# Patient Record
Sex: Female | Born: 1965 | Race: White | Hispanic: No | Marital: Married | Smoking: Never smoker
Health system: Southern US, Community
[De-identification: ages and names within clinical notes are randomized; demographics above are authoritative.]

## PROBLEM LIST (undated history)

## (undated) DIAGNOSIS — Z8619 Personal history of other infectious and parasitic diseases: Secondary | ICD-10-CM

## (undated) DIAGNOSIS — Z87442 Personal history of urinary calculi: Secondary | ICD-10-CM

## (undated) DIAGNOSIS — N6009 Solitary cyst of unspecified breast: Secondary | ICD-10-CM

## (undated) DIAGNOSIS — I10 Essential (primary) hypertension: Secondary | ICD-10-CM

## (undated) HISTORY — DX: Personal history of other infectious and parasitic diseases: Z86.19

## (undated) HISTORY — DX: Personal history of urinary calculi: Z87.442

## (undated) HISTORY — DX: Essential (primary) hypertension: I10

## (undated) HISTORY — DX: Solitary cyst of unspecified breast: N60.09

---

## 1995-05-09 HISTORY — PX: CRYOTHERAPY: SHX1416

## 1995-05-09 HISTORY — PX: COLPOSCOPY: SHX161

## 2005-02-05 DIAGNOSIS — N6009 Solitary cyst of unspecified breast: Secondary | ICD-10-CM

## 2005-02-05 HISTORY — DX: Solitary cyst of unspecified breast: N60.09

## 2005-02-07 ENCOUNTER — Ambulatory Visit: Payer: Self-pay | Admitting: Unknown Physician Specialty

## 2007-01-01 ENCOUNTER — Ambulatory Visit: Payer: Self-pay | Admitting: Unknown Physician Specialty

## 2008-04-01 ENCOUNTER — Ambulatory Visit: Payer: Self-pay | Admitting: Unknown Physician Specialty

## 2008-05-07 ENCOUNTER — Ambulatory Visit: Payer: Self-pay | Admitting: Urology

## 2008-06-10 ENCOUNTER — Ambulatory Visit: Payer: Self-pay | Admitting: Unknown Physician Specialty

## 2008-11-03 ENCOUNTER — Ambulatory Visit: Payer: Self-pay | Admitting: Urology

## 2008-11-10 ENCOUNTER — Ambulatory Visit: Payer: Self-pay | Admitting: Urology

## 2008-11-12 ENCOUNTER — Ambulatory Visit: Payer: Self-pay | Admitting: Urology

## 2008-12-03 ENCOUNTER — Ambulatory Visit: Payer: Self-pay | Admitting: Urology

## 2009-06-01 ENCOUNTER — Ambulatory Visit: Payer: Self-pay | Admitting: Urology

## 2009-11-15 ENCOUNTER — Ambulatory Visit: Payer: Self-pay | Admitting: Unknown Physician Specialty

## 2010-11-24 ENCOUNTER — Ambulatory Visit: Payer: Self-pay | Admitting: Unknown Physician Specialty

## 2010-12-05 ENCOUNTER — Ambulatory Visit: Payer: Self-pay | Admitting: Unknown Physician Specialty

## 2011-11-28 ENCOUNTER — Ambulatory Visit: Payer: Self-pay

## 2011-11-30 ENCOUNTER — Ambulatory Visit: Payer: Self-pay

## 2016-02-28 ENCOUNTER — Other Ambulatory Visit: Payer: Self-pay | Admitting: Certified Nurse Midwife

## 2016-02-28 DIAGNOSIS — Z1231 Encounter for screening mammogram for malignant neoplasm of breast: Secondary | ICD-10-CM

## 2016-03-01 ENCOUNTER — Ambulatory Visit: Admission: RE | Admit: 2016-03-01 | Payer: Self-pay | Source: Ambulatory Visit

## 2016-04-14 ENCOUNTER — Ambulatory Visit: Payer: Self-pay

## 2016-05-02 ENCOUNTER — Ambulatory Visit
Admission: RE | Admit: 2016-05-02 | Discharge: 2016-05-02 | Disposition: A | Discharge status: Home / Self Care | Payer: BC Managed Care – PPO | Source: Ambulatory Visit | Attending: Certified Nurse Midwife | Admitting: Certified Nurse Midwife

## 2016-05-02 DIAGNOSIS — Z1231 Encounter for screening mammogram for malignant neoplasm of breast: Secondary | ICD-10-CM | POA: Diagnosis present

## 2016-05-05 ENCOUNTER — Inpatient Hospital Stay
Admission: RE | Admit: 2016-05-05 | Discharge: 2016-05-05 | Disposition: A | Discharge status: Home / Self Care | Payer: Self-pay | Source: Ambulatory Visit | Attending: *Deleted | Admitting: *Deleted

## 2016-05-05 ENCOUNTER — Other Ambulatory Visit: Payer: Self-pay | Admitting: *Deleted

## 2016-05-05 DIAGNOSIS — Z9289 Personal history of other medical treatment: Secondary | ICD-10-CM

## 2016-05-09 ENCOUNTER — Ambulatory Visit: Payer: Self-pay

## 2016-05-10 ENCOUNTER — Other Ambulatory Visit: Payer: Self-pay | Admitting: Certified Nurse Midwife

## 2016-05-10 DIAGNOSIS — N6320 Unspecified lump in the left breast, unspecified quadrant: Secondary | ICD-10-CM

## 2016-05-10 DIAGNOSIS — R928 Other abnormal and inconclusive findings on diagnostic imaging of breast: Secondary | ICD-10-CM

## 2016-05-18 ENCOUNTER — Ambulatory Visit
Admission: RE | Admit: 2016-05-18 | Discharge: 2016-05-18 | Disposition: A | Discharge status: Home / Self Care | Payer: BC Managed Care – PPO | Source: Ambulatory Visit | Attending: Certified Nurse Midwife | Admitting: Certified Nurse Midwife

## 2016-05-18 DIAGNOSIS — N6320 Unspecified lump in the left breast, unspecified quadrant: Secondary | ICD-10-CM

## 2016-05-18 DIAGNOSIS — R928 Other abnormal and inconclusive findings on diagnostic imaging of breast: Secondary | ICD-10-CM

## 2016-05-18 DIAGNOSIS — N6002 Solitary cyst of left breast: Secondary | ICD-10-CM | POA: Insufficient documentation

## 2016-06-16 HISTORY — PX: COLONOSCOPY: SHX174

## 2017-02-27 ENCOUNTER — Ambulatory Visit: Payer: Self-pay | Admitting: Certified Nurse Midwife

## 2017-03-14 ENCOUNTER — Ambulatory Visit: Payer: Self-pay | Admitting: Certified Nurse Midwife

## 2017-03-27 ENCOUNTER — Ambulatory Visit (INDEPENDENT_AMBULATORY_CARE_PROVIDER_SITE_OTHER): Payer: BC Managed Care – PPO | Admitting: Certified Nurse Midwife

## 2017-03-27 ENCOUNTER — Encounter: Payer: Self-pay | Admitting: Certified Nurse Midwife

## 2017-03-27 ENCOUNTER — Ambulatory Visit: Payer: Self-pay | Admitting: Certified Nurse Midwife

## 2017-03-27 VITALS — BP 110/64 | HR 95 | Ht 65.0 in | Wt 160.0 lb

## 2017-03-27 DIAGNOSIS — Z124 Encounter for screening for malignant neoplasm of cervix: Secondary | ICD-10-CM | POA: Diagnosis not present

## 2017-03-27 DIAGNOSIS — Z01419 Encounter for gynecological examination (general) (routine) without abnormal findings: Secondary | ICD-10-CM | POA: Diagnosis not present

## 2017-03-27 DIAGNOSIS — Z1231 Encounter for screening mammogram for malignant neoplasm of breast: Secondary | ICD-10-CM

## 2017-03-27 DIAGNOSIS — N6019 Diffuse cystic mastopathy of unspecified breast: Secondary | ICD-10-CM

## 2017-03-27 DIAGNOSIS — Z1239 Encounter for other screening for malignant neoplasm of breast: Secondary | ICD-10-CM

## 2017-03-27 DIAGNOSIS — N644 Mastodynia: Secondary | ICD-10-CM | POA: Diagnosis not present

## 2017-03-27 NOTE — Progress Notes (Signed)
Gynecology Annual Exam  [facility] Chief Complaint:  Chief Complaint  Patient presents with  . Gynecologic Exam    History of Present Illness:Rachel Colon presents today for her annual exam. She is a 51 year old Caucasian/White female, G3 P2012, whose LMP was 03/16/2017. She is having no significant problems. Has been having cyclical  breast tenderness especially in the left breast. She has a history of FC changes.  Last Pap: 10/29/2013 Result: NORMAL , HPV Negative Ever had a abnormal pap yes in her 520s, requiring a colposcopy with a biopsy that showed normal results. She had a cryosurgery of her cervix.   Last Mammogram: 05/02/2017 Result: benign cyst in left breast-Birads 2  Osteoporosis Prevention: Exercise (walks every day), gets adequate calcium in her diet  Contraception Method: condoms.  Her menses are regular and come monthly , lasting 5-6 days with 1 heavier days requiring pad change q2hours , and are not painful  She has had no spotting.  The patient's past medical history is remarkable for breast cysts and kidney stones.. Since her last annual GYN exam 1 year ago, she has had a colonoscopy 06/14/2016 which showed hemorrhoids. She has also fractured a bone in her left foot. Wore a boot x 2 weeks. Still is painful.   She is sexually active and reports no problems with intercourse..  There is no family history of breast cancer The patient does do monthly self breast exams. There is no history of ovarian cancer   The patient is a minimal drinker and does not smoke.    Review of Systems: Review of Systems  Constitutional: Positive for weight loss (intentional 12 pound weight loss). Negative for chills and fever.  HENT: Negative for congestion, sinus pain and sore throat.   Eyes: Negative for blurred vision and pain.  Respiratory: Negative for hemoptysis, shortness of breath and wheezing.   Cardiovascular: Negative for chest pain, palpitations and leg  swelling.  Gastrointestinal: Negative for abdominal pain, blood in stool, diarrhea, heartburn, nausea and vomiting.  Genitourinary: Negative for dysuria, frequency, hematuria and urgency.  Musculoskeletal: Negative for back pain, joint pain and myalgias.       Positive for Left foot pain  Skin: Negative for itching and rash.  Neurological: Negative for dizziness, tingling and headaches.  Endo/Heme/Allergies: Negative for environmental allergies and polydipsia. Does not bruise/bleed easily.       Negative for hirsutism Positive for cyclical breast tenderness  Psychiatric/Behavioral: Negative for depression. The patient is not nervous/anxious and does not have insomnia.     Past Medical History:  Past Medical History:  Diagnosis Date  . Breast cyst 02/2005   bilateral  . H/O cold sores   . History of kidney stones     Past Surgical History:  Past Surgical History:  Procedure Laterality Date  . COLONOSCOPY  06/16/2016  . COLPOSCOPY  1997  . CRYOTHERAPY  1997    Family History:  Family History  Problem Relation Age of Onset  . Hypertension Father   . Heart attack Maternal Grandmother 50  . Breast cancer Neg Hx     Social History:  Social History   Socioeconomic History  . Marital status: Married    Spouse name: Not on file  . Number of children: 2  . Years of education: Not on file  . Highest education level: Not on file  Social Needs  . Financial resource strain: Not on file  . Food insecurity - worry:  Not on file  . Food insecurity - inability: Not on file  . Transportation needs - medical: Not on file  . Transportation needs - non-medical: Not on file  Occupational History  . Occupation: Geophysicist/field seismologistTeaching Assistant  Tobacco Use  . Smoking status: Never Smoker  . Smokeless tobacco: Never Used  Substance and Sexual Activity  . Alcohol use: Yes    Frequency: Never    Comment: rare  . Drug use: No  . Sexual activity: Yes    Partners: Male    Birth  control/protection: Condom  Other Topics Concern  . Not on file  Social History Narrative  . Not on file    Allergies:  Allergies  Allergen Reactions  . Penicillins Other (See Comments)    infancy     Medications: Current Outpatient Medications on File Prior to Visit  Medication Sig Dispense Refill  . acyclovir (ZOVIRAX) 400 MG tablet     . esomeprazole (NEXIUM) 40 MG capsule Take by mouth.    . ranitidine (ZANTAC) 150 MG capsule ranitidine 150 mg capsule     No current facility-administered medications on file prior to visit.   Physical Exam Vitals: BP 110/64   Pulse 95   Ht 5\' 5"  (1.651 m)   Wt 160 lb (72.6 kg)   LMP 03/16/2017 (Exact Date)   BMI 26.63 kg/m   General: WF in NAD HEENT: normocephalic, anicteric Neck: no thyroid enlargement, no palpable nodules, no cervical lymphadenopathy  Pulmonary: No increased work of breathing, CTAB Cardiovascular: RRR, without murmur  Breast: Breast symmetrical, no tenderness, no palpable nodules or masses, no skin or nipple retraction present, no nipple discharge.  No axillary, infraclavicular or supraclavicular lymphadenopathy. Abdomen: Soft, non-tender, non-distended.  Umbilicus without lesions.  No hepatomegaly or masses palpable. No evidence of hernia. Genitourinary:  External: Normal external female genitalia.  Normal urethral meatus, normal  Bartholin's and Skene's glands.    Vagina: Normal vaginal mucosa, no evidence of prolapse.    Cervix: Grossly normal in appearance, no bleeding, non-tender  Uterus: Anteverted, normal size, shape, and consistency, mobile, and non-tender  Adnexa: No adnexal masses, non-tender  Rectal: deferred  Lymphatic: no evidence of inguinal lymphadenopathy Extremities: no edema, erythema, or tenderness Neurologic: Grossly intact Psychiatric: mood appropriate, affect full     Assessment: 51 y.o. annual gyn exam Mastalgia, cyclical  Plan:    1) Breast cancer screening - recommend monthly  self breast exam and annual screening mammograms. Mammogram was ordered today.  2) DIscussed decreasing methylxanthines in diet .  Recommended vitamin E 400 IU BID.for mastalgia  3) Cervical cancer screening - Pap was done. ASCCP guidelines and rational discussed.  Patient opts for every 3 years screening interval  4) Contraception - Explained that contraception is needed until she has not had a menses x 1 year.  5) Routine healthcare maintenance including cholesterol and diabetes screening managed by PCP   6) RTO 1 year and prn  Farrel Connersolleen Dahmir Epperly, CNM

## 2017-03-30 ENCOUNTER — Encounter: Payer: Self-pay | Admitting: Certified Nurse Midwife

## 2017-03-30 DIAGNOSIS — N6019 Diffuse cystic mastopathy of unspecified breast: Secondary | ICD-10-CM | POA: Insufficient documentation

## 2017-03-30 LAB — IGP, APTIMA HPV
HPV APTIMA: NEGATIVE
PAP Smear Comment: 0

## 2017-05-28 ENCOUNTER — Encounter (INDEPENDENT_AMBULATORY_CARE_PROVIDER_SITE_OTHER): Payer: Self-pay

## 2017-05-28 ENCOUNTER — Ambulatory Visit
Admission: RE | Admit: 2017-05-28 | Discharge: 2017-05-28 | Disposition: A | Discharge status: Home / Self Care | Payer: BC Managed Care – PPO | Source: Ambulatory Visit | Attending: Certified Nurse Midwife | Admitting: Certified Nurse Midwife

## 2017-05-28 DIAGNOSIS — Z1231 Encounter for screening mammogram for malignant neoplasm of breast: Secondary | ICD-10-CM | POA: Insufficient documentation

## 2017-05-28 DIAGNOSIS — Z1239 Encounter for other screening for malignant neoplasm of breast: Secondary | ICD-10-CM

## 2018-04-10 ENCOUNTER — Other Ambulatory Visit: Payer: Self-pay | Admitting: Certified Nurse Midwife

## 2018-04-10 DIAGNOSIS — Z1231 Encounter for screening mammogram for malignant neoplasm of breast: Secondary | ICD-10-CM

## 2018-05-29 ENCOUNTER — Ambulatory Visit: Payer: BC Managed Care – PPO

## 2018-06-03 ENCOUNTER — Encounter (INDEPENDENT_AMBULATORY_CARE_PROVIDER_SITE_OTHER): Payer: Self-pay

## 2018-06-03 ENCOUNTER — Ambulatory Visit
Admission: RE | Admit: 2018-06-03 | Discharge: 2018-06-03 | Disposition: A | Discharge status: Home / Self Care | Payer: BC Managed Care – PPO | Source: Ambulatory Visit | Attending: Certified Nurse Midwife | Admitting: Certified Nurse Midwife

## 2018-06-03 DIAGNOSIS — Z1231 Encounter for screening mammogram for malignant neoplasm of breast: Secondary | ICD-10-CM | POA: Diagnosis present

## 2018-06-06 ENCOUNTER — Other Ambulatory Visit: Payer: Self-pay

## 2018-06-06 ENCOUNTER — Encounter: Payer: Self-pay | Admitting: Certified Nurse Midwife

## 2018-06-06 ENCOUNTER — Other Ambulatory Visit (HOSPITAL_COMMUNITY)
Admission: RE | Admit: 2018-06-06 | Discharge: 2018-06-06 | Disposition: A | Discharge status: Home / Self Care | Payer: BC Managed Care – PPO | Source: Ambulatory Visit | Attending: Certified Nurse Midwife | Admitting: Certified Nurse Midwife

## 2018-06-06 ENCOUNTER — Ambulatory Visit (INDEPENDENT_AMBULATORY_CARE_PROVIDER_SITE_OTHER): Payer: BC Managed Care – PPO | Admitting: Certified Nurse Midwife

## 2018-06-06 VITALS — BP 140/80 | Ht 65.0 in | Wt 166.0 lb

## 2018-06-06 DIAGNOSIS — N926 Irregular menstruation, unspecified: Secondary | ICD-10-CM

## 2018-06-06 DIAGNOSIS — Z124 Encounter for screening for malignant neoplasm of cervix: Secondary | ICD-10-CM

## 2018-06-06 DIAGNOSIS — Z13220 Encounter for screening for lipoid disorders: Secondary | ICD-10-CM

## 2018-06-06 DIAGNOSIS — R5383 Other fatigue: Secondary | ICD-10-CM

## 2018-06-06 DIAGNOSIS — Z01419 Encounter for gynecological examination (general) (routine) without abnormal findings: Secondary | ICD-10-CM

## 2018-06-06 DIAGNOSIS — Z131 Encounter for screening for diabetes mellitus: Secondary | ICD-10-CM

## 2018-06-06 MED ORDER — ACYCLOVIR 400 MG PO TABS: ORAL_TABLET | ORAL | 6 refills | Status: DC

## 2018-06-06 NOTE — Progress Notes (Signed)
Gynecology Annual Exam  [facility] Chief Complaint:  Chief Complaint  Patient presents with  . Gynecologic Exam    No complaints    History of Present Illness:Rachel G. Anne HahnWillis presents today for her annual exam. She is a 53 year old Caucasian/White female, G3 P2012, whose LMP was 05/24/2018. She has been skipping some menses and having some hot flashes. She has also been having some problems with insomnia.  Last Pap: 03/27/2017 Result: NORMAL , HPV Negative Ever had a abnormal pap yes in her 120s, requiring a colposcopy with a biopsy that showed normal results. She had a cryosurgery of her cervix.   Last Mammogram: 06/03/2018 Birads 1  Osteoporosis Prevention: Exercise (walks every day), gets adequate calcium in her diet  Contraception Method: condoms.  Her menses are usually monthly , lasting 5-7 days with 1 heavier days requiring pad change q2hours. She has skipped a couple of months this past year. She has had more cramping at the start of her menses and takes Tylenol or NSAIDs with relief. She has had no intermenstrual spotting.  The patient's past medical history is remarkable for breast cysts and kidney stones.. Since her last annual GYN exam 03/27/2017, she has had no other changes in her health. Had  a colonoscopy 06/14/2016 which showed hemorrhoids.   She is sexually active and reports no problems with intercourse..  There is no family history of breast cancer The patient does do monthly self breast exams. There is no history of ovarian cancer   The patient is a minimal drinker and does not smoke.  She has not had a recent lipid panel and requests testing    Review of Systems: Review of Systems  Constitutional: Positive for malaise/fatigue. Negative for chills, fever and weight loss (intentional 12 pound weight loss).  HENT: Negative for congestion, sinus pain and sore throat.   Eyes: Negative for blurred vision and pain.  Respiratory: Negative for  hemoptysis, shortness of breath and wheezing.   Cardiovascular: Negative for chest pain, palpitations and leg swelling.  Gastrointestinal: Negative for abdominal pain, blood in stool, diarrhea, heartburn, nausea and vomiting.  Genitourinary: Negative for dysuria, frequency, hematuria and urgency.       Positive for irregular menses  Musculoskeletal: Negative for back pain, joint pain and myalgias.       Positive for Left foot pain  Skin: Negative for itching and rash.  Neurological: Negative for dizziness, tingling and headaches.  Endo/Heme/Allergies: Negative for environmental allergies and polydipsia. Does not bruise/bleed easily.       Negative for hirsutism Positive for hot flashes  Psychiatric/Behavioral: Negative for depression. The patient has insomnia. The patient is not nervous/anxious.     Past Medical History:  Past Medical History:  Diagnosis Date  . Breast cyst 02/2005   bilateral  . H/O cold sores   . History of kidney stones     Past Surgical History:  Past Surgical History:  Procedure Laterality Date  . COLONOSCOPY  06/16/2016  . COLPOSCOPY  1997  . CRYOTHERAPY  1997    Family History:  Family History  Problem Relation Age of Onset  . Hypertension Father   . Heart attack Maternal Grandmother 50  . Breast cancer Neg Hx     Social History:  Social History   Socioeconomic History  . Marital status: Married    Spouse name: Not on file  . Number of children: 2  . Years of education: Not on file  .  Highest education level: Not on file  Occupational History  . Occupation: Geophysicist/field seismologistTeaching Assistant  Social Needs  . Financial resource strain: Not on file  . Food insecurity:    Worry: Not on file    Inability: Not on file  . Transportation needs:    Medical: Not on file    Non-medical: Not on file  Tobacco Use  . Smoking status: Never Smoker  . Smokeless tobacco: Never Used  Substance and Sexual Activity  . Alcohol use: Yes    Frequency: Never     Comment: rare  . Drug use: No  . Sexual activity: Yes    Partners: Male    Birth control/protection: Condom  Lifestyle  . Physical activity:    Days per week: 7 days    Minutes per session: 30 min  . Stress: Not at all  Relationships  . Social connections:    Talks on phone: More than three times a week    Gets together: More than three times a week    Attends religious service: More than 4 times per year    Active member of club or organization: No    Attends meetings of clubs or organizations: Never    Relationship status: Married  . Intimate partner violence:    Fear of current or ex partner: No    Emotionally abused: No    Physically abused: No    Forced sexual activity: No  Other Topics Concern  . Not on file  Social History Narrative  . Not on file    Allergies:  Allergies  Allergen Reactions  . Penicillins Other (See Comments)    infancy     Medications: Current Outpatient Medications on File Prior to Visit  Medication Sig Dispense Refill  . esomeprazole (NEXIUM) 40 MG capsule Take by mouth.    . ranitidine (ZANTAC) 150 MG capsule ranitidine 150 mg capsule     No current facility-administered medications on file prior to visit.   Physical Exam Vitals: BP 140/80 (BP Location: Right Arm, Patient Position: Sitting, Cuff Size: Normal)   Ht 5\' 5"  (1.651 m)   Wt 166 lb (75.3 kg)   LMP 05/24/2018   BMI 27.62 kg/m   General: WF in NAD HEENT: normocephalic, anicteric Neck: no thyroid enlargement, no palpable nodules, no cervical lymphadenopathy  Pulmonary: No increased work of breathing, CTAB Cardiovascular: RRR, without murmur  Breast: Breast symmetrical, no tenderness, no palpable nodules or masses, no skin or nipple retraction present, no nipple discharge.  No axillary, infraclavicular or supraclavicular lymphadenopathy. Abdomen: Soft, non-tender, non-distended.  Umbilicus without lesions.  No hepatomegaly or masses palpable. No evidence of  hernia. Genitourinary:  External: Normal external female genitalia.  Normal urethral meatus, normal Bartholin's and Skene's glands.    Vagina: Normal vaginal mucosa, no evidence of prolapse.    Cervix: Grossly normal in appearance, no bleeding, non-tender  Uterus: Anteverted, normal size, shape, and consistency, mobile, and non-tender  Adnexa: No adnexal masses, non-tender  Rectal: deferred  Lymphatic: no evidence of inguinal lymphadenopathy Extremities: no edema, erythema, or tenderness Neurologic: Grossly intact Psychiatric: mood appropriate, affect full     Assessment: 53 y.o. annual gyn exam Irregular menses/ fatigue-R/O thyroid disease, R/PO anemia   Plan:    1) Breast cancer screening - recommend monthly self breast exam and annual screening mammograms. Mammogram is UTD  2) CBC, TSH to evaluate fatigue and irregular menses.   3) Cervical cancer screening - Pap was done. ASCCP guidelines and rational discussed.  Patient opts for every 2-3 years screening interval  4) Contraception - Explained that contraception is needed until she has not had a menses x 1 year.  5) Routine healthcare maintenance including cholesterol and diabetes screening ordered. Recommended trying magnesium glycinate to help her sleep. Recommended vitamin D3 600 IU daily and 1000 calcium daily.  6) RTO 1 year and prn  Farrel Conners, CNM

## 2018-06-06 NOTE — Patient Instructions (Addendum)
Magnesium glycinate 400 mgm daily for sleep Vitamin D3 600 IU daily Perimenopause  Perimenopause is the normal time of life before and after menstrual periods stop completely (menopause). Perimenopause can begin 2-8 years before menopause, and it usually lasts for 1 year after menopause. During perimenopause, the ovaries may or may not produce an egg. What are the causes? This condition is caused by a natural change in hormone levels that happens as you get older. What increases the risk? This condition is more likely to start at an earlier age if you have certain medical conditions or treatments, including:  A tumor of the pituitary gland in the brain.  A disease that affects the ovaries and hormone production.  Radiation treatment for cancer.  Certain cancer treatments, such as chemotherapy or hormone (anti-estrogen) therapy.  Heavy smoking and excessive alcohol use.  Family history of early menopause. What are the signs or symptoms? Perimenopausal changes affect each woman differently. Symptoms of this condition may include:  Hot flashes.  Night sweats.  Irregular menstrual periods.  Decreased sex drive.  Vaginal dryness.  Headaches.  Mood swings.  Depression.  Memory problems or trouble concentrating.  Irritability.  Tiredness.  Weight gain.  Anxiety.  Trouble getting pregnant. How is this diagnosed? This condition is diagnosed based on your medical history, a physical exam, your age, your menstrual history, and your symptoms. Hormone tests may also be done. How is this treated? In some cases, no treatment is needed. You and your health care provider should make a decision together about whether treatment is necessary. Treatment will be based on your individual condition and preferences. Various treatments are available, such as:  Menopausal hormone therapy (MHT).  Medicines to treat specific symptoms.  Acupuncture.  Vitamin or herbal  supplements. Before starting treatment, make sure to let your health care provider know if you have a personal or family history of:  Heart disease.  Breast cancer.  Blood clots.  Diabetes.  Osteoporosis. Follow these instructions at home: Lifestyle  Do not use any products that contain nicotine or tobacco, such as cigarettes and e-cigarettes. If you need help quitting, ask your health care provider.  Eat a balanced diet that includes fresh fruits and vegetables, whole grains, soybeans, eggs, lean meat, and low-fat dairy.  Get at least 30 minutes of physical activity on 5 or more days each week.  Avoid alcoholic and caffeinated beverages, as well as spicy foods. This may help prevent hot flashes.  Get 7-8 hours of sleep each night.  Dress in layers that can be removed to help you manage hot flashes.  Find ways to manage stress, such as deep breathing, meditation, or journaling. General instructions  Keep track of your menstrual periods, including: ? When they occur. ? How heavy they are and how long they last. ? How much time passes between periods.  Keep track of your symptoms, noting when they start, how often you have them, and how long they last.  Take over-the-counter and prescription medicines only as told by your health care provider.  Take vitamin supplements only as told by your health care provider. These may include calcium, vitamin E, and vitamin D.  Use vaginal lubricants or moisturizers to help with vaginal dryness and improve comfort during sex.  Talk with your health care provider before starting any herbal supplements.  Keep all follow-up visits as told by your health care provider. This is important. This includes any group therapy or counseling. Contact a health care provider if:  You have heavy vaginal bleeding or pass blood clots.  Your period lasts more than 2 days longer than normal.  Your periods are recurring sooner than 21 days.  You  bleed after having sex. Get help right away if:  You have chest pain, trouble breathing, or trouble talking.  You have severe depression.  You have pain when you urinate.  You have severe headaches.  You have vision problems. Summary  Perimenopause is the time when a woman's body begins to move into menopause. This may happen naturally or as a result of other health problems or medical treatments.  Perimenopause can begin 2-8 years before menopause, and it usually lasts for 1 year after menopause.  Perimenopausal symptoms can be managed through medicines, lifestyle changes, and complementary therapies such as acupuncture. This information is not intended to replace advice given to you by your health care provider. Make sure you discuss any questions you have with your health care provider. Document Released: 06/01/2004 Document Revised: 05/30/2016 Document Reviewed: 05/30/2016 Elsevier Interactive Patient Education  2019 [employer].

## 2018-06-10 LAB — CYTOLOGY - PAP: Diagnosis: NEGATIVE

## 2018-06-12 ENCOUNTER — Other Ambulatory Visit: Payer: BC Managed Care – PPO

## 2018-06-17 ENCOUNTER — Other Ambulatory Visit: Payer: BC Managed Care – PPO

## 2018-06-18 ENCOUNTER — Other Ambulatory Visit: Payer: BC Managed Care – PPO

## 2018-08-16 IMAGING — MG MM DIGITAL DIAGNOSTIC UNILAT*L* W/ TOMO W/ CAD
6 series · 6 of 14 positions shown · non-contrast
Comparison: Previous exam(s).

CLINICAL DATA: Callback from screening mammogram for possible left
breast mass

EXAM:
2D DIGITAL DIAGNOSTIC LEFT MAMMOGRAM WITH CAD AND ADJUNCT TOMO
ULTRASOUND LEFT BREAST

[L MLO]
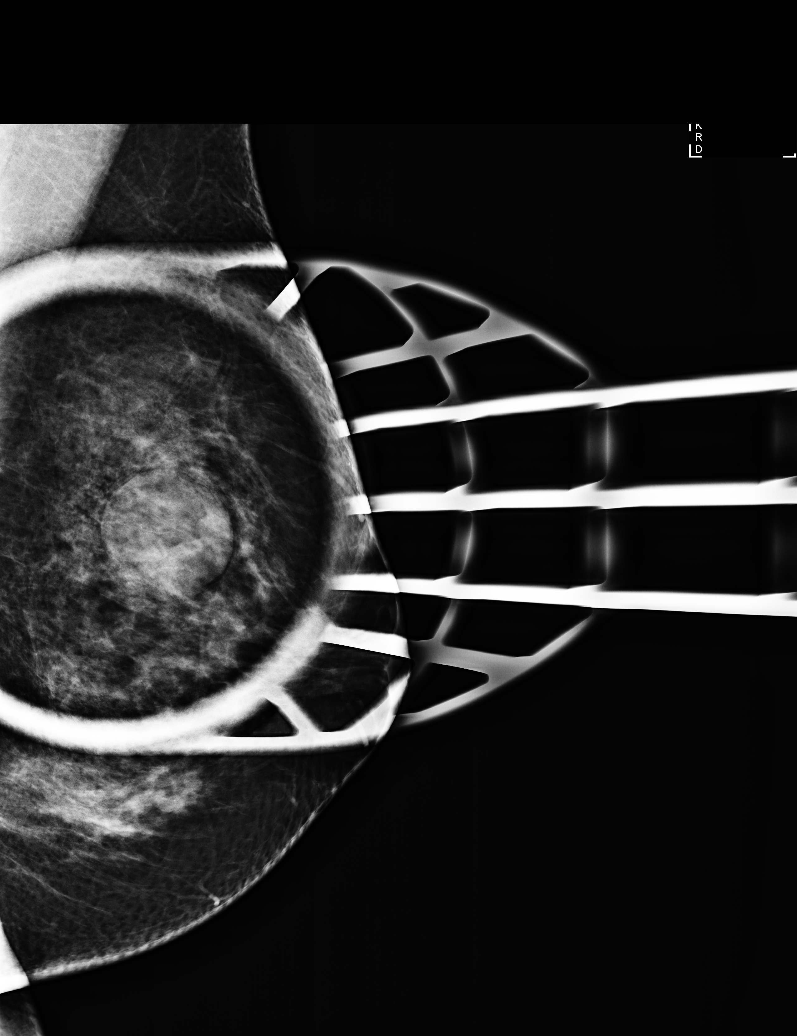

[L MLO synth-2D]
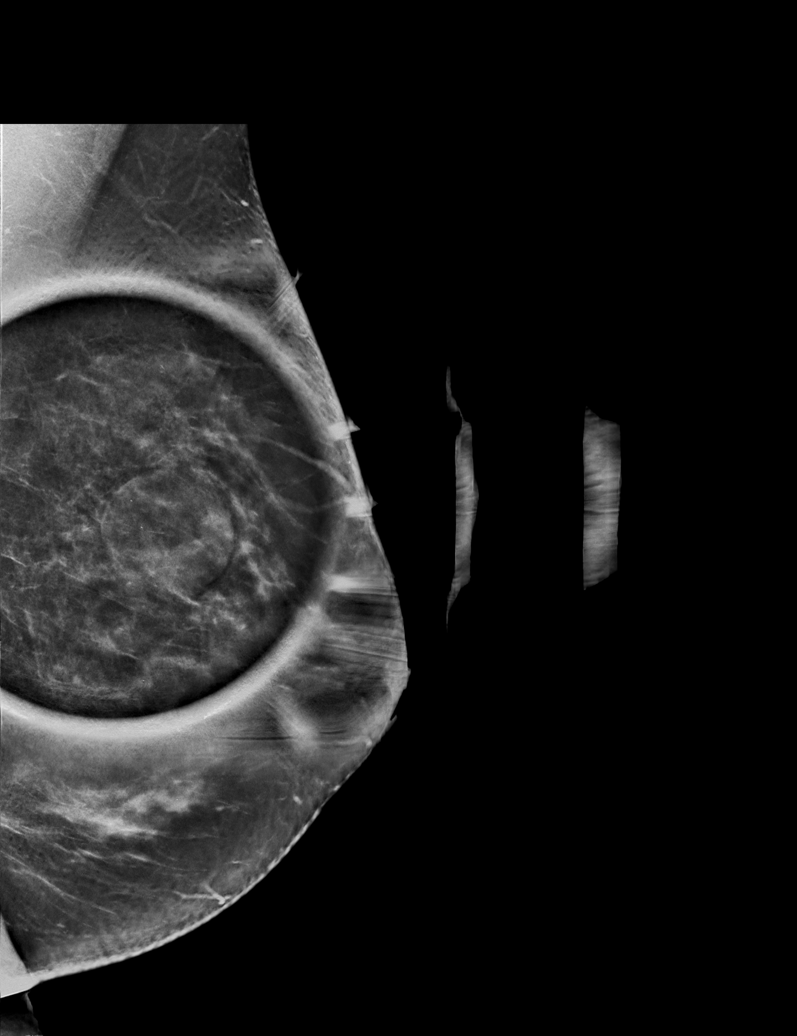

[L CC]
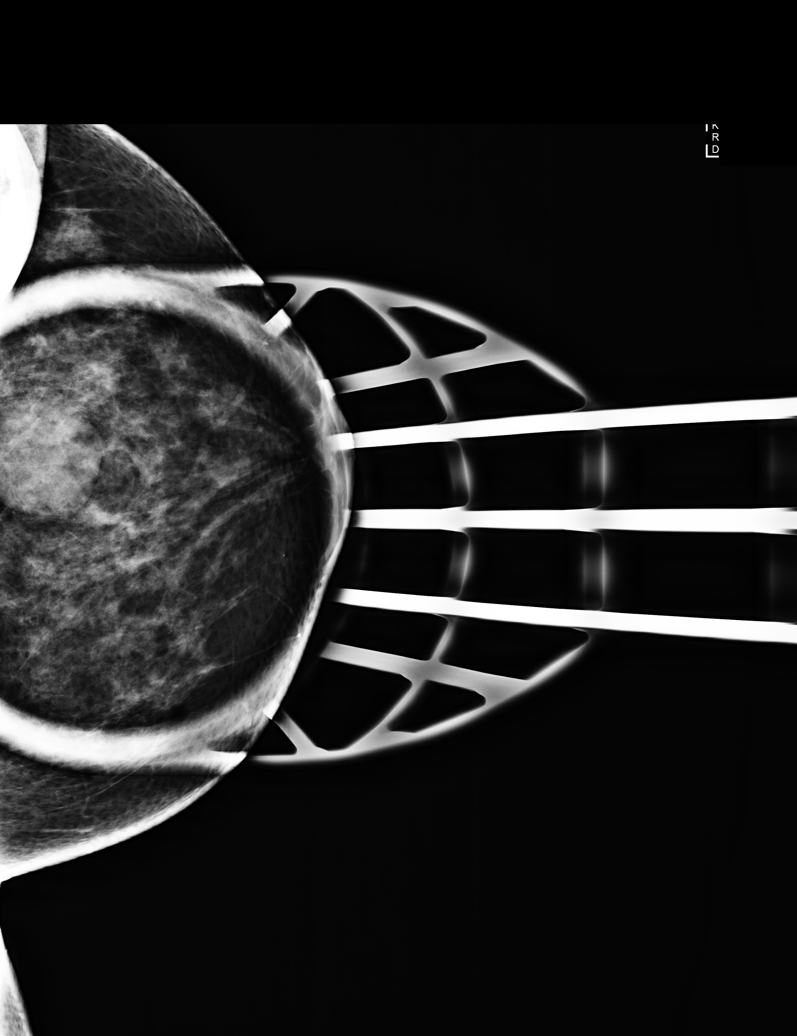

[L CC synth-2D]
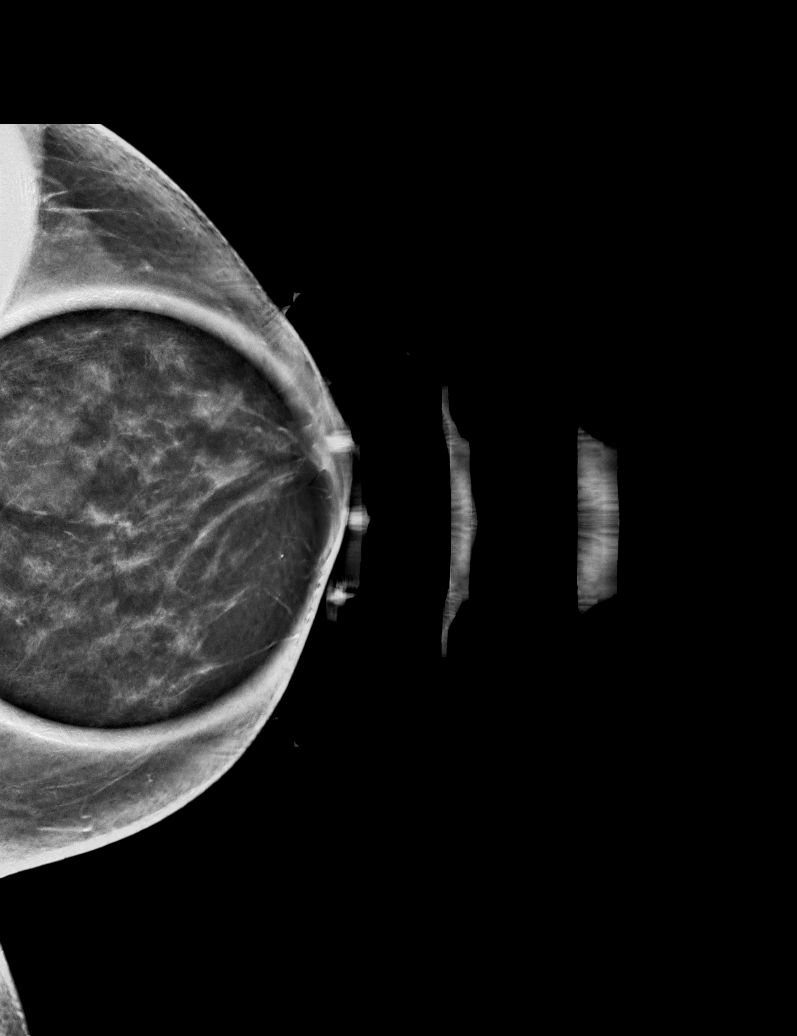

[L CC tomo · tomo slice 40/79.0]
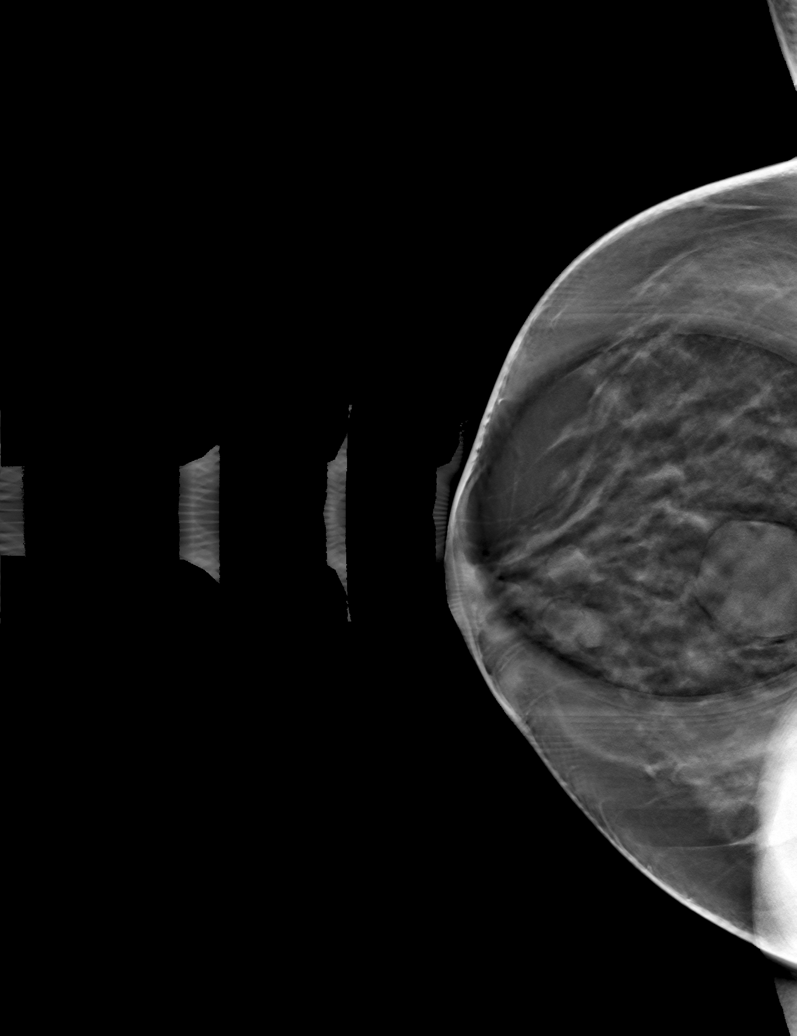

[L MLO tomo · tomo slice 41/82.0]
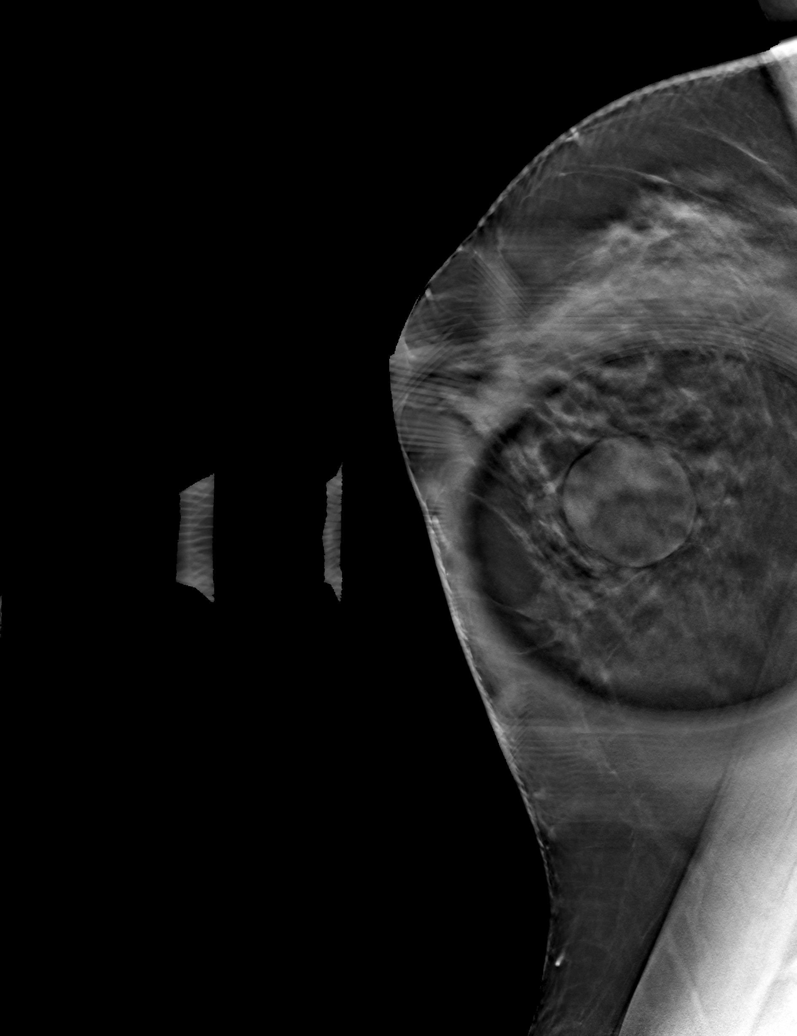

[6 of 14 positions shown; findings below may reference images not displayed]

ACR Breast Density Category c: The breast tissue is heterogeneously
dense, which may obscure small masses.
FINDINGS: On the additional views, there is an approximately 2.5 cm oval,
circumscribed, low-density mass within the slightly superior left
breast, posterior depth.

Mammographic images were processed with CAD.

On physical exam, no discrete mass is felt in the area of concern
within the slightly superior left breast.

Targeted ultrasound is performed, showing an oval, circumscribed,
anechoic mass measuring 2.5 x 1.7 x 2.4 cm at 12 o'clock, 1 cm from
the nipple, consistent with a cyst. No internal vascularity is
identified.
IMPRESSION: Left breast cyst.

RECOMMENDATION:
Screening mammogram in one year.(Code:8S-K-BXK)

I have discussed the findings and recommendations with the patient.
Results were also provided in writing at the conclusion of the
visit. If applicable, a reminder letter will be sent to the patient
regarding the next appointment.

BI-RADS CATEGORY  2: Benign.

## 2018-09-10 IMAGING — US US BREAST*L* LIMITED INC AXILLA
1 series · 13 of 17 positions shown · non-contrast
Comparison: Previous exam(s).

CLINICAL DATA: Callback from screening mammogram for possible left
breast mass

EXAM:
2D DIGITAL DIAGNOSTIC LEFT MAMMOGRAM WITH CAD AND ADJUNCT TOMO
ULTRASOUND LEFT BREAST

[Series 1: us breast*left* limited inc axilla · 0.09mm/px · 13 of 17 slices shown]
[im 1/17]
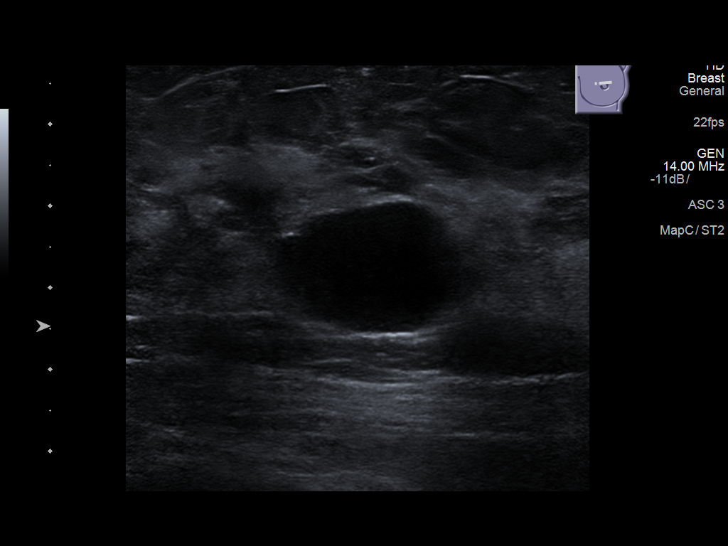
[im 2/17]
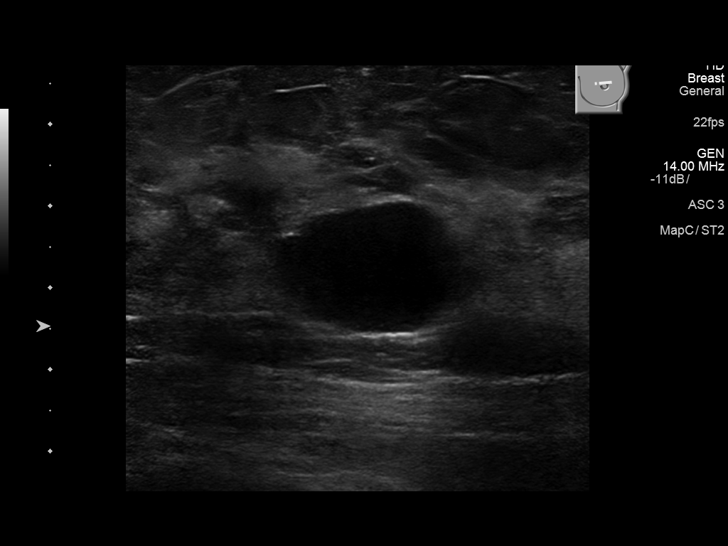
[im 4/17]
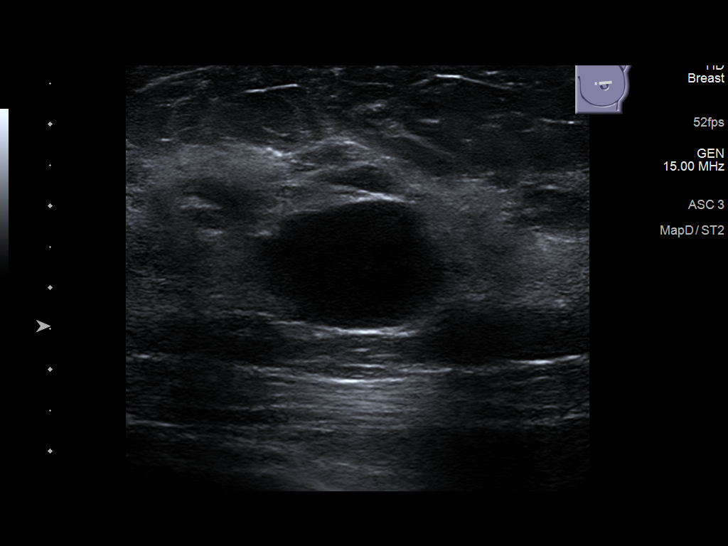
[im 5/17]
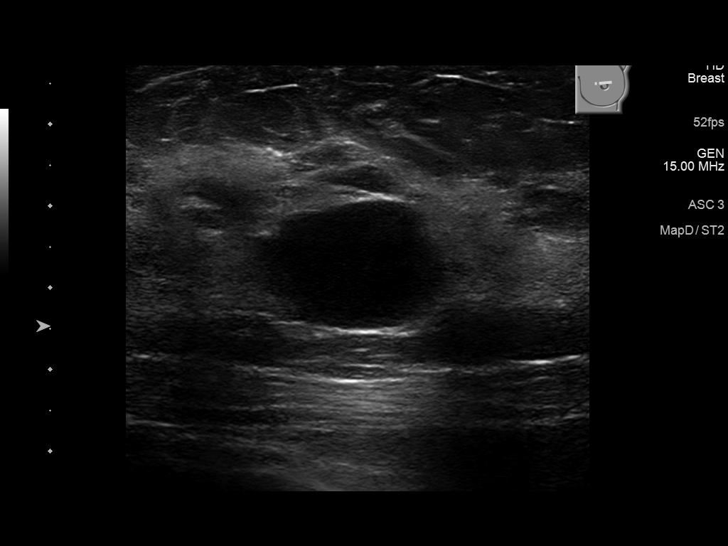
[im 6/17]
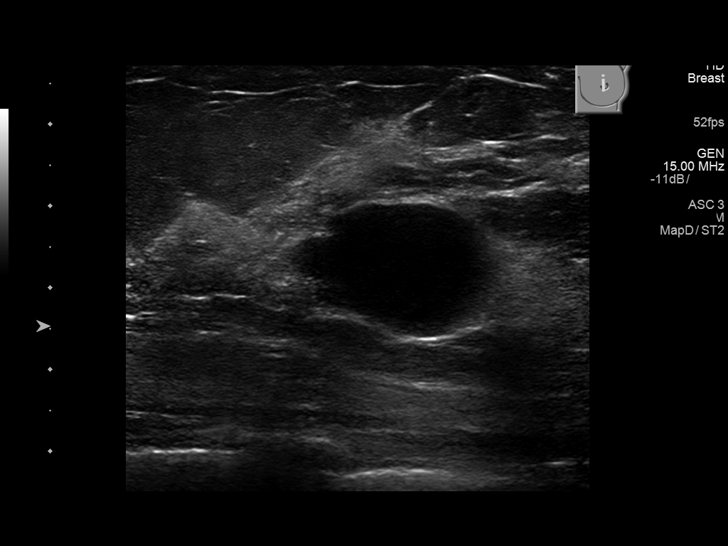
[im 8/17]
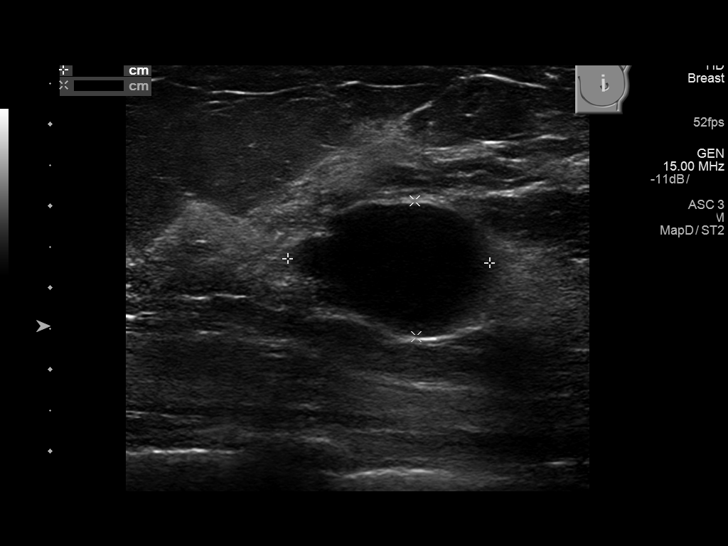
[im 9/17]
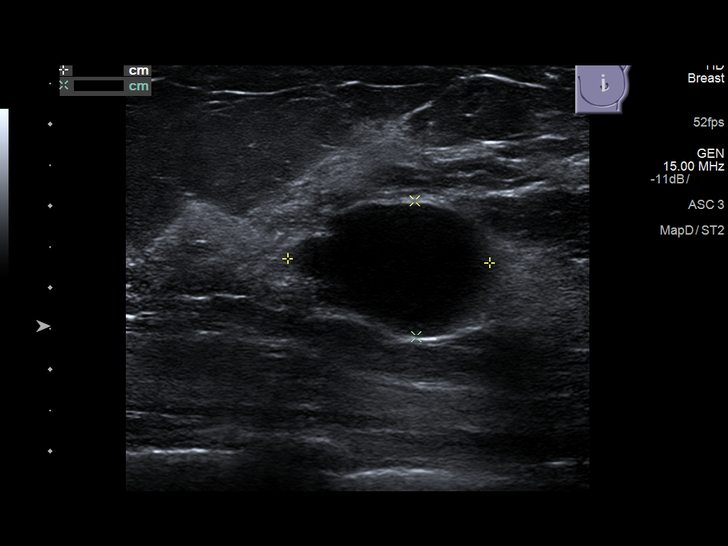
[im 10/17]
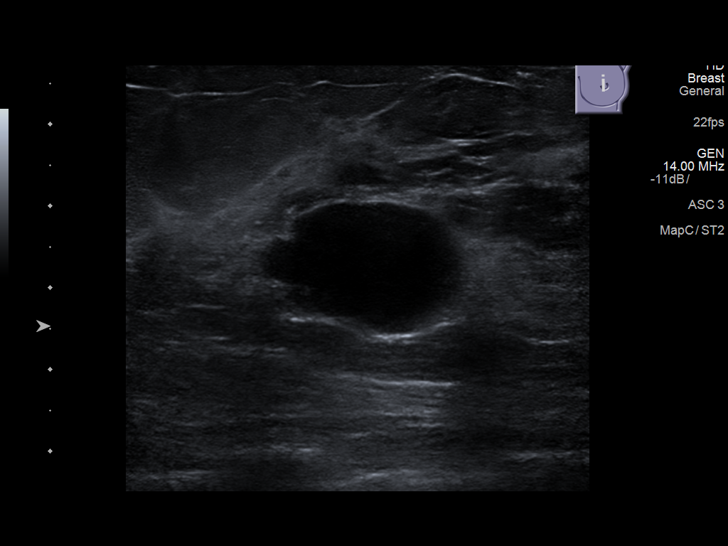
[im 12/17]
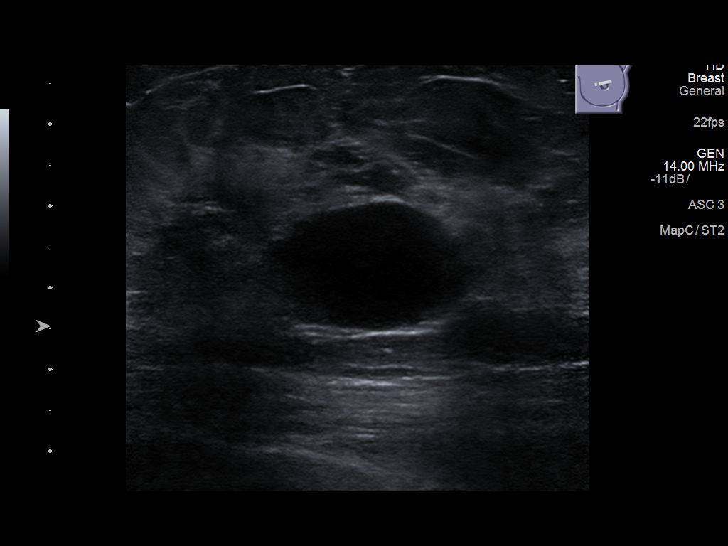
[im 13/17]
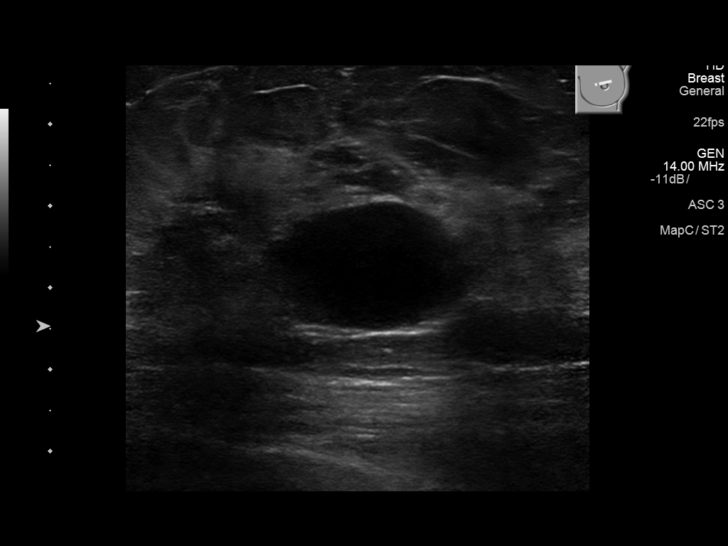
[im 14/17]
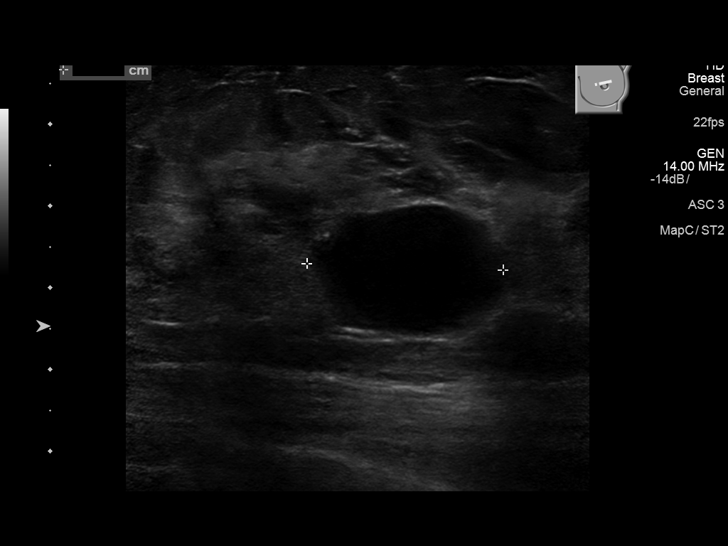
[im 16/17]
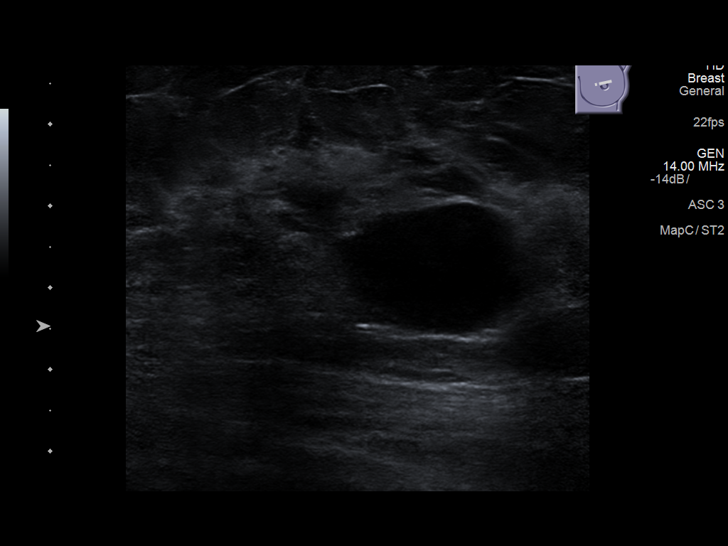
[im 17/17]
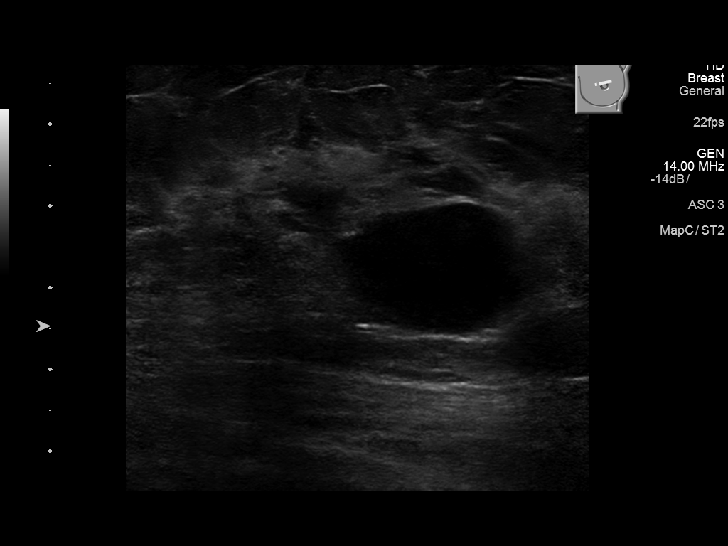

[13 of 17 positions shown; findings below may reference images not displayed]

ACR Breast Density Category c: The breast tissue is heterogeneously
dense, which may obscure small masses.
FINDINGS: On the additional views, there is an approximately 2.5 cm oval,
circumscribed, low-density mass within the slightly superior left
breast, posterior depth.

Mammographic images were processed with CAD.

On physical exam, no discrete mass is felt in the area of concern
within the slightly superior left breast.

Targeted ultrasound is performed, showing an oval, circumscribed,
anechoic mass measuring 2.5 x 1.7 x 2.4 cm at 12 o'clock, 1 cm from
the nipple, consistent with a cyst. No internal vascularity is
identified.
IMPRESSION: Left breast cyst.

RECOMMENDATION:
Screening mammogram in one year.(Code:8S-K-BXK)

I have discussed the findings and recommendations with the patient.
Results were also provided in writing at the conclusion of the
visit. If applicable, a reminder letter will be sent to the patient
regarding the next appointment.

BI-RADS CATEGORY  2: Benign.

## 2019-07-22 ENCOUNTER — Ambulatory Visit (INDEPENDENT_AMBULATORY_CARE_PROVIDER_SITE_OTHER): Payer: BC Managed Care – PPO | Admitting: Obstetrics and Gynecology

## 2019-07-22 ENCOUNTER — Encounter: Payer: Self-pay | Admitting: Obstetrics and Gynecology

## 2019-07-22 ENCOUNTER — Other Ambulatory Visit: Payer: Self-pay | Admitting: Certified Nurse Midwife

## 2019-07-22 ENCOUNTER — Other Ambulatory Visit: Payer: Self-pay

## 2019-07-22 VITALS — BP 140/90 | Ht 65.0 in | Wt 167.0 lb

## 2019-07-22 DIAGNOSIS — N3001 Acute cystitis with hematuria: Secondary | ICD-10-CM | POA: Diagnosis not present

## 2019-07-22 DIAGNOSIS — Z1231 Encounter for screening mammogram for malignant neoplasm of breast: Secondary | ICD-10-CM

## 2019-07-22 LAB — POCT URINALYSIS DIPSTICK
Bilirubin, UA: NEGATIVE
Glucose, UA: NEGATIVE
Ketones, UA: NEGATIVE
Nitrite, UA: NEGATIVE
Protein, UA: NEGATIVE
Spec Grav, UA: 1.015 (ref 1.010–1.025)
pH, UA: 6 (ref 5.0–8.0)

## 2019-07-22 MED ORDER — NITROFURANTOIN MONOHYD MACRO 100 MG PO CAPS: 100.0000 mg | ORAL_CAPSULE | Freq: Two times a day (BID) | ORAL | 0 refills | Status: AC

## 2019-07-22 NOTE — Patient Instructions (Signed)
I value your feedback and entrusting us with your care. If you get a [facility] patient survey, I would appreciate you taking the time to let us know about your experience today. Thank you!  As of April 17, 2019, your lab results will be released to your MyChart immediately, before I even have a chance to see them. Please give me time to review them and contact you if there are any abnormalities. Thank you for your patience.  

## 2019-07-22 NOTE — Progress Notes (Signed)
[facility]   Chief Complaint  Patient presents with  . Urinary Tract Infection    frequency and painful urinating, back pain, no blood started this past weekend    HPI:      [name] is a 54 y.o. [id number] who LMP was Patient's last menstrual period was 04/24/2019 (approximate)., presents today for UTI symptoms of urinary frequency, dysuria and LBP for a few days. No hematuria, pelvic pain, fevers, vag sx. No vag bleeding. Hx of UTIs in past, last one about a yr ago. Seems to get them this time of year. Hx of kidney stone in past.  Past Medical History:  Diagnosis Date  . Breast cyst 02/2005   bilateral  . H/O cold sores   . History of kidney stones     Past Surgical History:  Procedure Laterality Date  . COLONOSCOPY  06/16/2016  . COLPOSCOPY  1997  . CRYOTHERAPY  1997    Family History  Problem Relation Age of Onset  . Hypertension Father   . Heart attack Maternal Grandmother 50  . Breast cancer Neg Hx     Social History   Socioeconomic History  . Marital status: Married    Spouse name: Not on file  . Number of children: 2  . Years of education: Not on file  . Highest education level: Not on file  Occupational History  . Occupation: Geophysicist/field seismologist  Tobacco Use  . Smoking status: Never Smoker  . Smokeless tobacco: Never Used  Substance and Sexual Activity  . Alcohol use: Yes    Comment: rare  . Drug use: No  . Sexual activity: Not Currently    Partners: Male    Birth control/protection: Condom  Other Topics Concern  . Not on file  Social History Narrative  . Not on file   Social Determinants of Health   Financial Resource Strain:   . Difficulty of Paying Living Expenses:   Food Insecurity:   . Worried About Programme researcher, broadcasting/film/video in the Last Year:   . Barista in the Last Year:   Transportation Needs:   . Freight forwarder (Medical):   Marland Kitchen Lack of Transportation (Non-Medical):   Physical Activity:    . Days of Exercise per Week:   . Minutes of Exercise per Session:   Stress:   . Feeling of Stress :   Social Connections:   . Frequency of Communication with Friends and Family:   . Frequency of Social Gatherings with Friends and Family:   . Attends Religious Services:   . Active Member of Clubs or Organizations:   . Attends Banker Meetings:   Marland Kitchen Marital Status:   Intimate Partner Violence:   . Fear of Current or Ex-Partner:   . Emotionally Abused:   Marland Kitchen Physically Abused:   . Sexually Abused:     Outpatient Medications Prior to Visit  Medication Sig Dispense Refill  . acyclovir (ZOVIRAX) 400 MG tablet Take 400 mgm tid x 5 days prn cold sores 30 tablet 6  . esomeprazole (NEXIUM) 40 MG capsule Take by mouth.    . fexofenadine (ALLEGRA) 60 MG tablet Take 60 mg by mouth 2 (two) times daily.    . ranitidine (ZANTAC) 150 MG capsule ranitidine 150 mg capsule     No facility-administered medications prior to visit.      ROS:  Review of Systems  Constitutional: Negative for fever.  Gastrointestinal: Negative for  blood in stool, constipation, diarrhea, nausea and vomiting.  Genitourinary: Positive for dysuria and frequency. Negative for dyspareunia, flank pain, hematuria, urgency, vaginal bleeding, vaginal discharge and vaginal pain.  Musculoskeletal: Positive for back pain.  Skin: Negative for rash.   BREAST: No symptoms   OBJECTIVE:   Vitals:  BP 140/90   Ht 5\' 5"  (1.651 m)   Wt 167 lb (75.8 kg)   LMP 04/24/2019 (Approximate)   BMI 27.79 kg/m   Physical Exam Vitals reviewed.  Constitutional:      Appearance: She is well-developed. She is not ill-appearing or toxic-appearing.  Pulmonary:     Effort: Pulmonary effort is normal.  Abdominal:     Tenderness: There is no right CVA tenderness or left CVA tenderness.  Musculoskeletal:        General: Normal range of motion.     Cervical back: Normal range of motion.  Neurological:     General: No focal  deficit present.     Mental Status: She is alert and oriented to person, place, and time.     Cranial Nerves: No cranial nerve deficit.  Psychiatric:        Behavior: Behavior normal.        Thought Content: Thought content normal.        Judgment: Judgment normal.     Results: Results for orders placed or performed in visit on 07/22/19 (from the past 24 hour(s))  POCT Urinalysis Dipstick     Status: Abnormal   Collection Time: 07/22/19  3:03 PM  Result Value Ref Range   Color, UA yellow    Clarity, UA clear    Glucose, UA Negative Negative   Bilirubin, UA neg    Ketones, UA neg    Spec Grav, UA 1.015 1.010 - 1.025   Blood, UA mod    pH, UA 6.0 5.0 - 8.0   Protein, UA Negative Negative   Urobilinogen, UA     Nitrite, UA neg    Leukocytes, UA Small (1+) (A) Negative   Appearance     Odor       Assessment/Plan: Acute cystitis with hematuria - Plan: POCT Urinalysis Dipstick, nitrofurantoin, macrocrystal-monohydrate, (MACROBID) 100 MG capsule, Urine Culture; Pos sx and UA. Rx macrobid. Check C&S. F/u prn.   Meds ordered this encounter  Medications  . nitrofurantoin, macrocrystal-monohydrate, (MACROBID) 100 MG capsule    Sig: Take 1 capsule (100 mg total) by mouth 2 (two) times daily for 5 days.    Dispense:  10 capsule    Refill:  0    Order Specific Question:   Supervising Provider    Answer:   Gae Dry [[id number]]      Return if symptoms worsen or fail to improve.  Kellyanne Ellwanger B. Latash Nouri, PA-C 07/22/2019 3:04 PM

## 2019-07-24 LAB — URINE CULTURE

## 2019-08-08 ENCOUNTER — Ambulatory Visit
Admission: RE | Admit: 2019-08-08 | Discharge: 2019-08-08 | Disposition: A | Discharge status: Home / Self Care | Payer: BC Managed Care – PPO | Source: Ambulatory Visit | Attending: Certified Nurse Midwife | Admitting: Certified Nurse Midwife

## 2019-08-08 DIAGNOSIS — Z1231 Encounter for screening mammogram for malignant neoplasm of breast: Secondary | ICD-10-CM | POA: Insufficient documentation

## 2019-08-20 NOTE — Patient Instructions (Signed)
I value your feedback and entrusting us with your care. If you get a [facility] patient survey, I would appreciate you taking the time to let us know about your experience today. Thank you!  As of April 17, 2019, your lab results will be released to your MyChart immediately, before I even have a chance to see them. Please give me time to review them and contact you if there are any abnormalities. Thank you for your patience.  

## 2019-08-20 NOTE — Progress Notes (Signed)
PCP: [facility]   Chief Complaint  Patient presents with  . Gynecologic Exam    HPI:      Rachel Colon is a 54 y.o. [id number] who LMP was Patient's last menstrual period was 04/24/2019 (approximate)., presents today for her annual examination.  Her menses are irregular due to perimenopause, occurring every 2-3 months, lasting 5-6 days.  Dysmenorrhea mild, no BTB. She does have tolerable vasomotor sx.   UTI treated 3/21with macrobid with sx relief. Still has urinary urgency occas but may be related to caffeine.  Sex activity: not sexually active.   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 There is no FH of ovarian cancer. The patient does do self-breast exams.  Colonoscopy: 2018, Repeat due after 10 years.   Tobacco use: The patient denies current or previous tobacco use. Alcohol use: none Exercise: very active  She does get adequate calcium but not Vitamin D in her diet.  Labs with PCP. Needs RF on acyclovir prn cold sore.   Past Medical History:  Diagnosis Date  . Breast cyst 02/2005   bilateral  . H/O cold sores   . History of kidney stones     Past Surgical History:  Procedure Laterality Date  . COLONOSCOPY  06/16/2016  . COLPOSCOPY  1997  . CRYOTHERAPY  1997    Family History  Problem Relation Age of Onset  . Hypertension Father   . Heart attack Maternal Grandmother 50  . Breast cancer Neg Hx     Social History   Socioeconomic History  . Marital status: Married    Spouse name: Not on file  . Number of children: 2  . Years of education: Not on file  . Highest education level: Not on file  Occupational History  . Occupation: Geophysicist/field seismologist  Tobacco Use  . Smoking status: Never Smoker  . Smokeless tobacco: Never Used   Substance and Sexual Activity  . Alcohol use: Yes    Comment: rare  . Drug use: No  . Sexual activity: Not Currently    Partners: Male    Birth control/protection: Condom  Other Topics Concern  . Not on file  Social History Narrative  . Not on file   Social Determinants of Health   Financial Resource Strain:   . Difficulty of Paying Living Expenses:   Food Insecurity:   . Worried About Programme researcher, broadcasting/film/video in the Last Year:   . Barista in the Last Year:   Transportation Needs:   . Freight forwarder (Medical):   Marland Kitchen Lack of Transportation (Non-Medical):   Physical Activity:   . Days of Exercise per Week:   . Minutes of Exercise per Session:   Stress:   . Feeling of Stress :   Social Connections:   . Frequency of Communication with Friends and Family:   . Frequency of Social Gatherings with Friends and Family:   . Attends Religious Services:   . Active Member of Clubs or Organizations:   . Attends Banker Meetings:   Marland Kitchen Marital Status:   Intimate Partner Violence:   . Fear of Current or Ex-Partner:   . Emotionally Abused:   Marland Kitchen Physically Abused:   . Sexually Abused:  Current Outpatient Medications:  .  esomeprazole (NEXIUM) 40 MG capsule, Take by mouth., Disp: , Rfl:  .  fexofenadine (ALLEGRA) 60 MG tablet, Take 60 mg by mouth 2 (two) times daily., Disp: , Rfl:  .  acyclovir (ZOVIRAX) 400 MG tablet, Take 1 tablet (400 mg total) by mouth 5 (five) times daily. For 5 days prn sx, Disp: 25 tablet, Rfl: 2     ROS:  Review of Systems  Constitutional: Negative for fatigue, fever and unexpected weight change.  Respiratory: Negative for cough, shortness of breath and wheezing.   Cardiovascular: Negative for chest pain, palpitations and leg swelling.  Gastrointestinal: Negative for blood in stool, constipation, diarrhea, nausea and vomiting.  Endocrine: Negative for cold intolerance, heat intolerance and polyuria.  Genitourinary: Negative for  dyspareunia, dysuria, flank pain, frequency, genital sores, hematuria, menstrual problem, pelvic pain, urgency, vaginal bleeding, vaginal discharge and vaginal pain.  Musculoskeletal: Negative for back pain, joint swelling and myalgias.  Skin: Negative for rash.  Neurological: Negative for dizziness, syncope, light-headedness, numbness and headaches.  Hematological: Negative for adenopathy.  Psychiatric/Behavioral: Negative for agitation, confusion, sleep disturbance and suicidal ideas. The patient is not nervous/anxious.   BREAST: No symptoms    Objective: BP (!) 148/80   Ht 5\' 5"  (1.651 m)   Wt 166 lb (75.3 kg)   LMP 04/24/2019 (Approximate)   BMI 27.62 kg/m    Physical Exam Constitutional:      Appearance: She is well-developed.  Genitourinary:     Vulva, vagina, cervix, uterus, right adnexa and left adnexa normal.     No vulval lesion or tenderness noted.     No vaginal discharge, erythema or tenderness.     No cervical polyp.     Uterus is not enlarged or tender.     No right or left adnexal mass present.     Right adnexa not tender.     Left adnexa not tender.  Neck:     Thyroid: No thyromegaly.  Cardiovascular:     Rate and Rhythm: Normal rate and regular rhythm.     Heart sounds: Normal heart sounds. No murmur.  Pulmonary:     Effort: Pulmonary effort is normal.     Breath sounds: Normal breath sounds.  Chest:     Breasts:        Right: No mass, nipple discharge, skin change or tenderness.        Left: No mass, nipple discharge, skin change or tenderness.  Abdominal:     Palpations: Abdomen is soft.     Tenderness: There is no abdominal tenderness. There is no guarding.  Musculoskeletal:        General: Normal range of motion.     Cervical back: Normal range of motion.  Neurological:     General: No focal deficit present.     Mental Status: She is alert and oriented to person, place, and time.     Cranial Nerves: No cranial nerve deficit.  Skin:     General: Skin is warm and dry.  Psychiatric:        Mood and Affect: Mood normal.        Behavior: Behavior normal.        Thought Content: Thought content normal.        Judgment: Judgment normal.  Vitals reviewed.     Assessment/Plan:  Encounter for annual routine gynecological examination  Encounter for screening mammogram for malignant neoplasm of breast; pt current on mammo  Cold sore -  Plan: acyclovir (ZOVIRAX) 400 MG tablet; Rx RF eRxd.   Perimenopause--f/u prn DUB  Meds ordered this encounter  Medications  . acyclovir (ZOVIRAX) 400 MG tablet    Sig: Take 1 tablet (400 mg total) by mouth 5 (five) times daily. For 5 days prn sx    Dispense:  25 tablet    Refill:  2    Order Specific Question:   Supervising Provider    Answer:   Nadara Mustard [[id number]]            GYN counsel breast self exam, mammography screening, menopause, adequate intake of calcium and vitamin D, diet and exercise    F/U  Return in about 1 year (around 08/20/2020).  Madyn Ivins B. Shiraz Bastyr, PA-C 08/21/2019 10:30 AM

## 2019-08-21 ENCOUNTER — Other Ambulatory Visit: Payer: Self-pay

## 2019-08-21 ENCOUNTER — Ambulatory Visit (INDEPENDENT_AMBULATORY_CARE_PROVIDER_SITE_OTHER): Payer: BC Managed Care – PPO | Admitting: Obstetrics and Gynecology

## 2019-08-21 ENCOUNTER — Encounter: Payer: Self-pay | Admitting: Obstetrics and Gynecology

## 2019-08-21 VITALS — BP 148/80 | Ht 65.0 in | Wt 166.0 lb

## 2019-08-21 DIAGNOSIS — Z1231 Encounter for screening mammogram for malignant neoplasm of breast: Secondary | ICD-10-CM

## 2019-08-21 DIAGNOSIS — B001 Herpesviral vesicular dermatitis: Secondary | ICD-10-CM | POA: Diagnosis not present

## 2019-08-21 DIAGNOSIS — Z01419 Encounter for gynecological examination (general) (routine) without abnormal findings: Secondary | ICD-10-CM | POA: Diagnosis not present

## 2019-08-21 MED ORDER — ACYCLOVIR 400 MG PO TABS: 400.0000 mg | ORAL_TABLET | Freq: Every day | ORAL | 2 refills | Status: DC

## 2020-04-07 ENCOUNTER — Telehealth: Payer: Self-pay

## 2020-04-07 ENCOUNTER — Other Ambulatory Visit: Payer: BC Managed Care – PPO

## 2020-04-07 ENCOUNTER — Other Ambulatory Visit: Payer: Self-pay

## 2020-04-07 DIAGNOSIS — L659 Nonscarring hair loss, unspecified: Secondary | ICD-10-CM

## 2020-04-07 NOTE — Telephone Encounter (Signed)
Labs placed, pt to sched non-fasting lab appt. Will f/u with results. If neg, just stress related.

## 2020-04-07 NOTE — Telephone Encounter (Signed)
Transferred to Tonya to schedule lab appt.

## 2020-04-07 NOTE — Telephone Encounter (Addendum)
Patient states she is experiencing more hair loss than normal. Unsure if it is stress, menopausal symptoms or if she is lacking in some vitamins. Inquiring if she needs to be seen or have labs done? [phone]

## 2020-04-07 NOTE — Telephone Encounter (Signed)
Called pt, no answer, LVMTRC. 

## 2020-04-08 LAB — IRON,TIBC AND FERRITIN PANEL
Ferritin: 67 ng/mL (ref 15–150)
Iron Saturation: 24 % (ref 15–55)
Iron: 91 ug/dL (ref 27–159)
Total Iron Binding Capacity: 386 ug/dL (ref 250–450)
UIBC: 295 ug/dL (ref 131–425)

## 2020-04-08 LAB — TSH+FREE T4
Free T4: 0.89 ng/dL (ref 0.82–1.77)
TSH: 2.37 u[IU]/mL (ref 0.450–4.500)

## 2020-04-08 NOTE — Progress Notes (Signed)
Pls call pt to let her know her hair loss labs are normal. Sx most likely related to stress. Add MVI without iron. F/u prn. Thx

## 2020-04-09 ENCOUNTER — Telehealth: Payer: Self-pay

## 2020-04-09 ENCOUNTER — Telehealth: Payer: Self-pay | Admitting: Obstetrics and Gynecology

## 2020-04-09 NOTE — Telephone Encounter (Signed)
This encounter was created in error - please disregard.

## 2020-04-09 NOTE — Telephone Encounter (Signed)
Pls give pt results msg sent on labs via MyChart. She must not have read it. Thx

## 2020-04-09 NOTE — Telephone Encounter (Signed)
LMVM to notifiy pt.

## 2020-04-09 NOTE — Telephone Encounter (Signed)
Patient calling requesting information in regards to test results.

## 2020-04-09 NOTE — Addendum Note (Signed)
Addended by: Kathlene Cote on: 04/09/2020 04:30 PM   Modules accepted: Level of Service, SmartSet

## 2020-04-09 NOTE — Telephone Encounter (Signed)
LMVM to notify patient. 

## 2020-06-09 NOTE — Patient Instructions (Incomplete)
I value your feedback and you entrusting us with your care. If you get a [facility] patient survey, I would appreciate you taking the time to let us know about your experience today. Thank you! ? ? ?

## 2020-06-10 ENCOUNTER — Ambulatory Visit: Payer: BC Managed Care – PPO | Admitting: Obstetrics and Gynecology

## 2020-08-26 ENCOUNTER — Ambulatory Visit: Payer: BC Managed Care – PPO | Admitting: Obstetrics and Gynecology

## 2020-09-07 NOTE — Patient Instructions (Addendum)
I value your feedback and you entrusting us with your care. If you get a [facility] patient survey, I would appreciate you taking the time to let us know about your experience today. Thank you!  [facility]: [phone]      

## 2020-09-07 NOTE — Progress Notes (Signed)
PCP: [facility]   Chief Complaint  Patient presents with  . Gynecologic Exam    No concerns    HPI:      [name] is a 56 y.o. [id number] who LMP was Patient's last menstrual period was 08/01/2020 (approximate)., presents today for her annual examination.  Her menses are irregular due to perimenopause, occurring infrequently, went 6 months once, lasting 3-5 days, light flow.  Dysmenorrhea none, no BTB. She does have tolerable vasomotor sx.  Pt is noticing occas anxiety episodes. Doesn't want meds, wonders if related to menopause.   Sex activity: not sexually active.   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 There is no FH of ovarian cancer. The patient does self-breast exams.  Colonoscopy: 2018, Repeat due after 10 years.   Tobacco use: The patient denies current or previous tobacco use. Alcohol use: none  No drug use Exercise: very active  She does get adequate calcium and Vitamin D in her diet.  Labs with PCP. Needs RF on acyclovir prn cold sore.   Past Medical History:  Diagnosis Date  . Breast cyst 02/2005   bilateral  . H/O cold sores   . History of kidney stones     Past Surgical History:  Procedure Laterality Date  . COLONOSCOPY  06/16/2016  . COLPOSCOPY  1997  . CRYOTHERAPY  1997    Family History  Problem Relation Age of Onset  . Hypertension Father   . Heart attack Maternal Grandmother 50  . Breast cancer Neg Hx     Social History   Socioeconomic History  . Marital status: Married    Spouse name: Not on file  . Number of children: 2  . Years of education: Not on file  . Highest education level: Not on file  Occupational History  . Occupation: Geophysicist/field seismologist  Tobacco Use  . Smoking status: Never Smoker  .  Smokeless tobacco: Never Used  Vaping Use  . Vaping Use: Never used  Substance and Sexual Activity  . Alcohol use: Yes    Comment: rare  . Drug use: No  . Sexual activity: Not Currently    Partners: Male    Birth control/protection: Condom  Other Topics Concern  . Not on file  Social History Narrative  . Not on file   Social Determinants of Health   Financial Resource Strain: Not on file  Food Insecurity: Not on file  Transportation Needs: Not on file  Physical Activity: Not on file  Stress: Not on file  Social Connections: Not on file  Intimate Partner Violence: Not on file     Current Outpatient Medications:  .  esomeprazole (NEXIUM) 40 MG capsule, Take by mouth., Disp: , Rfl:  .  fexofenadine (ALLEGRA) 60 MG tablet, Take 60 mg by mouth 2 (two) times daily., Disp: , Rfl:  .  acyclovir (ZOVIRAX) 400 MG tablet, Take 1 tablet (400 mg total) by mouth 5 (five) times daily. For 5 days prn sx, Disp: 25 tablet, Rfl: 2     ROS:  Review of Systems  Constitutional: Negative for fatigue, fever and unexpected weight change.  Respiratory: Negative for cough, shortness of breath and wheezing.   Cardiovascular: Negative for chest pain, palpitations and leg swelling.  Gastrointestinal: Negative  for blood in stool, constipation, diarrhea, nausea and vomiting.  Endocrine: Negative for cold intolerance, heat intolerance and polyuria.  Genitourinary: Negative for dyspareunia, dysuria, flank pain, frequency, genital sores, hematuria, menstrual problem, pelvic pain, urgency, vaginal bleeding, vaginal discharge and vaginal pain.  Musculoskeletal: Negative for back pain, joint swelling and myalgias.  Skin: Negative for rash.  Neurological: Negative for dizziness, syncope, light-headedness, numbness and headaches.  Hematological: Negative for adenopathy.  Psychiatric/Behavioral: Positive for agitation. Negative for confusion, sleep disturbance and suicidal ideas. The patient is not  nervous/anxious.   BREAST: No symptoms    Objective: BP 130/90   Ht 5\' 5"  (1.651 m)   Wt 166 lb (75.3 kg)   LMP 08/01/2020 (Approximate)   BMI 27.62 kg/m    Physical Exam Constitutional:      Appearance: She is well-developed.  Genitourinary:     Vulva normal.     Right Labia: No rash, tenderness or lesions.    Left Labia: No tenderness, lesions or rash.    No vaginal discharge, erythema or tenderness.      Right Adnexa: not tender and no mass present.    Left Adnexa: not tender and no mass present.    No cervical friability or polyp.     Uterus is not enlarged or tender.  Breasts:     Right: No mass, nipple discharge, skin change or tenderness.     Left: No mass, nipple discharge, skin change or tenderness.    Neck:     Thyroid: No thyromegaly.  Cardiovascular:     Rate and Rhythm: Normal rate and regular rhythm.     Heart sounds: Normal heart sounds. No murmur heard.   Pulmonary:     Effort: Pulmonary effort is normal.     Breath sounds: Normal breath sounds.  Abdominal:     Palpations: Abdomen is soft.     Tenderness: There is no abdominal tenderness. There is no guarding or rebound.  Musculoskeletal:        General: Normal range of motion.     Cervical back: Normal range of motion.  Lymphadenopathy:     Cervical: No cervical adenopathy.  Neurological:     General: No focal deficit present.     Mental Status: She is alert and oriented to person, place, and time.     Cranial Nerves: No cranial nerve deficit.  Skin:    General: Skin is warm and dry.  Psychiatric:        Mood and Affect: Mood normal.        Behavior: Behavior normal.        Thought Content: Thought content normal.        Judgment: Judgment normal.  Vitals reviewed.     Assessment/Plan: Encounter for annual routine gynecological examination  Encounter for screening mammogram for malignant neoplasm of breast - Plan: MM 3D SCREEN BREAST BILATERAL; pt to sched  mammo  Perimenopause--f/u prn DUB  Cold sore - Plan: acyclovir (ZOVIRAX) 400 MG tablet; Rx RF.   Anxiety--discussed stress mgmt techniques. Can do ativan prn, pt to call if feels like she needs Rx. Even if from perimenopause, sx still treated with anxiety meds. F/u prn.    Meds ordered this encounter  Medications  . acyclovir (ZOVIRAX) 400 MG tablet    Sig: Take 1 tablet (400 mg total) by mouth 5 (five) times daily. For 5 days prn sx    Dispense:  25 tablet    Refill:  2    Order Specific  Question:   Supervising Provider    Answer:   Nadara Mustard [[id number]]            GYN counsel breast self exam, mammography screening, menopause, adequate intake of calcium and vitamin D, diet and exercise    F/U  Return in about 1 year (around 09/08/2021).  Lynniah Janoski B. Camaya Gannett, PA-C 09/08/2020 11:22 AM

## 2020-09-08 ENCOUNTER — Ambulatory Visit (INDEPENDENT_AMBULATORY_CARE_PROVIDER_SITE_OTHER): Payer: BC Managed Care – PPO | Admitting: Obstetrics and Gynecology

## 2020-09-08 ENCOUNTER — Encounter: Payer: Self-pay | Admitting: Obstetrics and Gynecology

## 2020-09-08 ENCOUNTER — Other Ambulatory Visit: Payer: Self-pay

## 2020-09-08 VITALS — BP 130/90 | Ht 65.0 in | Wt 166.0 lb

## 2020-09-08 DIAGNOSIS — Z1231 Encounter for screening mammogram for malignant neoplasm of breast: Secondary | ICD-10-CM

## 2020-09-08 DIAGNOSIS — F419 Anxiety disorder, unspecified: Secondary | ICD-10-CM | POA: Diagnosis not present

## 2020-09-08 DIAGNOSIS — Z01419 Encounter for gynecological examination (general) (routine) without abnormal findings: Secondary | ICD-10-CM

## 2020-09-08 DIAGNOSIS — B001 Herpesviral vesicular dermatitis: Secondary | ICD-10-CM

## 2020-09-08 MED ORDER — ACYCLOVIR 400 MG PO TABS: 400.0000 mg | ORAL_TABLET | Freq: Every day | ORAL | 2 refills | Status: DC

## 2020-09-28 ENCOUNTER — Ambulatory Visit
Admission: RE | Admit: 2020-09-28 | Discharge: 2020-09-28 | Disposition: A | Discharge status: Home / Self Care | Payer: BC Managed Care – PPO | Source: Ambulatory Visit | Attending: Obstetrics and Gynecology | Admitting: Obstetrics and Gynecology

## 2020-09-28 ENCOUNTER — Other Ambulatory Visit: Payer: Self-pay

## 2020-09-28 DIAGNOSIS — Z1231 Encounter for screening mammogram for malignant neoplasm of breast: Secondary | ICD-10-CM | POA: Diagnosis present

## 2021-05-08 DIAGNOSIS — I639 Cerebral infarction, unspecified: Secondary | ICD-10-CM

## 2021-05-08 HISTORY — DX: Cerebral infarction, unspecified: I63.9

## 2021-08-29 ENCOUNTER — Other Ambulatory Visit: Payer: Self-pay | Admitting: Obstetrics and Gynecology

## 2021-08-29 DIAGNOSIS — Z1231 Encounter for screening mammogram for malignant neoplasm of breast: Secondary | ICD-10-CM

## 2021-09-22 ENCOUNTER — Ambulatory Visit: Payer: BC Managed Care – PPO | Admitting: Obstetrics and Gynecology

## 2021-10-06 ENCOUNTER — Ambulatory Visit: Payer: BC Managed Care – PPO

## 2021-11-02 NOTE — Progress Notes (Unsigned)
PCP: [facility]   No chief complaint on file.   HPI:      [name] is a 56 y.o. [id number] who LMP was No LMP recorded. (Menstrual status: Irregular Periods)., presents today for her annual examination.  Her menses are irregular due to perimenopause, occurring infrequently, went 6 months once, lasting 3-5 days, light flow.  Dysmenorrhea none, no BTB. She does have tolerable vasomotor sx.  Pt is noticing occas anxiety episodes. Doesn't want meds, wonders if related to menopause.   Sex activity: not sexually active.   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 There is no FH of ovarian cancer. The patient does self-breast exams.  Colonoscopy: 2018, Repeat due after 10 years.   Tobacco use: The patient denies current or previous tobacco use. Alcohol use: none  No drug use Exercise: very active  She does get adequate calcium and Vitamin D in her diet.  Labs with PCP. Needs RF on acyclovir prn cold sore.   Past Medical History:  Diagnosis Date   Breast cyst 02/2005   bilateral   H/O cold sores    History of kidney stones     Past Surgical History:  Procedure Laterality Date   COLONOSCOPY  06/16/2016   COLPOSCOPY  1997   CRYOTHERAPY  1997    Family History  Problem Relation Age of Onset   Hypertension Father    Heart attack Maternal Grandmother 30   Breast cancer Neg Hx     Social History   Socioeconomic History   Marital status: Married    Spouse name: Not on file   Number of children: 2   Years of education: Not on file   Highest education level: Not on file  Occupational History   Occupation: Geophysicist/field seismologist  Tobacco Use   Smoking status: Never   Smokeless tobacco: Never  Vaping Use   Vaping Use: Never used   Substance and Sexual Activity   Alcohol use: Yes    Comment: rare   Drug use: No   Sexual activity: Not Currently    Partners: Male    Birth control/protection: Condom  Other Topics Concern   Not on file  Social History Narrative   Not on file   Social Determinants of Health   Financial Resource Strain: Not on file  Food Insecurity: Not on file  Transportation Needs: Not on file  Physical Activity: Sufficiently Active (03/27/2017)   Exercise Vital Sign    Days of Exercise per Week: 7 days    Minutes of Exercise per Session: 30 min  Stress: No Stress Concern Present (03/27/2017)   [employer] of Occupational Health - Occupational Stress Questionnaire    Feeling of Stress : Not at all  Social Connections: Moderately Integrated (03/27/2017)   Social Connection and Isolation Panel [NHANES]    Frequency of Communication with Friends and Family: More than three times a week    Frequency of Social Gatherings with Friends and Family: More than three times a week    Attends Religious Services: More than 4 times per year    Active Member of [employer] or Organizations: No    Attends Banker Meetings: Never    Marital Status: Married  Catering manager Violence: Not  At Risk (03/27/2017)   Humiliation, Afraid, Rape, and Kick questionnaire    Fear of Current or Ex-Partner: No    Emotionally Abused: No    Physically Abused: No    Sexually Abused: No     Current Outpatient Medications:    acyclovir (ZOVIRAX) 400 MG tablet, Take 1 tablet (400 mg total) by mouth 5 (five) times daily. For 5 days prn sx, Disp: 25 tablet, Rfl: 2   esomeprazole (NEXIUM) 40 MG capsule, Take by mouth., Disp: , Rfl:    fexofenadine (ALLEGRA) 60 MG tablet, Take 60 mg by mouth 2 (two) times daily., Disp: , Rfl:      ROS:  Review of Systems  Constitutional:  Negative for fatigue, fever and unexpected weight change.  Respiratory:  Negative for cough, shortness of breath and wheezing.    Cardiovascular:  Negative for chest pain, palpitations and leg swelling.  Gastrointestinal:  Negative for blood in stool, constipation, diarrhea, nausea and vomiting.  Endocrine: Negative for cold intolerance, heat intolerance and polyuria.  Genitourinary:  Negative for dyspareunia, dysuria, flank pain, frequency, genital sores, hematuria, menstrual problem, pelvic pain, urgency, vaginal bleeding, vaginal discharge and vaginal pain.  Musculoskeletal:  Negative for back pain, joint swelling and myalgias.  Skin:  Negative for rash.  Neurological:  Negative for dizziness, syncope, light-headedness, numbness and headaches.  Hematological:  Negative for adenopathy.  Psychiatric/Behavioral:  Positive for agitation. Negative for confusion, sleep disturbance and suicidal ideas. The patient is not nervous/anxious.   BREAST: No symptoms    Objective: There were no vitals taken for this visit.   Physical Exam Constitutional:      Appearance: She is well-developed.  Genitourinary:     Vulva normal.     Right Labia: No rash, tenderness or lesions.    Left Labia: No tenderness, lesions or rash.    No vaginal discharge, erythema or tenderness.      Right Adnexa: not tender and no mass present.    Left Adnexa: not tender and no mass present.    No cervical friability or polyp.     Uterus is not enlarged or tender.  Breasts:    Right: No mass, nipple discharge, skin change or tenderness.     Left: No mass, nipple discharge, skin change or tenderness.  Neck:     Thyroid: No thyromegaly.  Cardiovascular:     Rate and Rhythm: Normal rate and regular rhythm.     Heart sounds: Normal heart sounds. No murmur heard. Pulmonary:     Effort: Pulmonary effort is normal.     Breath sounds: Normal breath sounds.  Abdominal:     Palpations: Abdomen is soft.     Tenderness: There is no abdominal tenderness. There is no guarding or rebound.  Musculoskeletal:        General: Normal range of motion.      Cervical back: Normal range of motion.  Lymphadenopathy:     Cervical: No cervical adenopathy.  Neurological:     General: No focal deficit present.     Mental Status: She is alert and oriented to person, place, and time.     Cranial Nerves: No cranial nerve deficit.  Skin:    General: Skin is warm and dry.  Psychiatric:        Mood and Affect: Mood normal.        Behavior: Behavior normal.        Thought Content: Thought content normal.        Judgment: Judgment  normal.  Vitals reviewed.     Assessment/Plan: Encounter for annual routine gynecological examination  Encounter for screening mammogram for malignant neoplasm of breast - Plan: MM 3D SCREEN BREAST BILATERAL; pt to sched mammo  Perimenopause--f/u prn DUB  Cold sore - Plan: acyclovir (ZOVIRAX) 400 MG tablet; Rx RF.   Anxiety--discussed stress mgmt techniques. Can do ativan prn, pt to call if feels like she needs Rx. Even if from perimenopause, sx still treated with anxiety meds. F/u prn.    No orders of the defined types were placed in this encounter.           GYN counsel breast self exam, mammography screening, menopause, adequate intake of calcium and vitamin D, diet and exercise    F/U  No follow-ups on file.  Marco Adelson B. Jordie Skalsky, PA-C 11/02/2021 5:26 PM

## 2021-11-03 ENCOUNTER — Encounter: Payer: Self-pay | Admitting: Obstetrics and Gynecology

## 2021-11-03 ENCOUNTER — Ambulatory Visit (INDEPENDENT_AMBULATORY_CARE_PROVIDER_SITE_OTHER): Payer: BC Managed Care – PPO | Admitting: Obstetrics and Gynecology

## 2021-11-03 ENCOUNTER — Ambulatory Visit
Admission: RE | Admit: 2021-11-03 | Discharge: 2021-11-03 | Disposition: A | Discharge status: Home / Self Care | Payer: BC Managed Care – PPO | Source: Ambulatory Visit | Attending: Obstetrics and Gynecology | Admitting: Obstetrics and Gynecology

## 2021-11-03 ENCOUNTER — Other Ambulatory Visit (HOSPITAL_COMMUNITY)
Admission: RE | Admit: 2021-11-03 | Discharge: 2021-11-03 | Disposition: A | Discharge status: Home / Self Care | Payer: BC Managed Care – PPO | Source: Ambulatory Visit | Attending: Obstetrics and Gynecology | Admitting: Obstetrics and Gynecology

## 2021-11-03 VITALS — BP 118/80 | Ht 65.0 in | Wt 165.0 lb

## 2021-11-03 DIAGNOSIS — Z1151 Encounter for screening for human papillomavirus (HPV): Secondary | ICD-10-CM | POA: Diagnosis present

## 2021-11-03 DIAGNOSIS — Z124 Encounter for screening for malignant neoplasm of cervix: Secondary | ICD-10-CM

## 2021-11-03 DIAGNOSIS — B001 Herpesviral vesicular dermatitis: Secondary | ICD-10-CM

## 2021-11-03 DIAGNOSIS — Z1231 Encounter for screening mammogram for malignant neoplasm of breast: Secondary | ICD-10-CM | POA: Diagnosis not present

## 2021-11-03 DIAGNOSIS — R399 Unspecified symptoms and signs involving the genitourinary system: Secondary | ICD-10-CM | POA: Diagnosis not present

## 2021-11-03 DIAGNOSIS — Z01419 Encounter for gynecological examination (general) (routine) without abnormal findings: Secondary | ICD-10-CM | POA: Diagnosis not present

## 2021-11-03 LAB — POCT URINALYSIS DIPSTICK
Bilirubin, UA: NEGATIVE
Blood, UA: NEGATIVE
Glucose, UA: NEGATIVE
Ketones, UA: NEGATIVE
Leukocytes, UA: NEGATIVE
Nitrite, UA: NEGATIVE
Protein, UA: NEGATIVE
Spec Grav, UA: 1.025 (ref 1.010–1.025)
pH, UA: 7.5 (ref 5.0–8.0)

## 2021-11-03 MED ORDER — ACYCLOVIR 400 MG PO TABS: 400.0000 mg | ORAL_TABLET | Freq: Every day | ORAL | 2 refills | Status: DC

## 2021-11-03 NOTE — Patient Instructions (Signed)
I value your feedback and you entrusting us with your care. If you get a [facility] patient survey, I would appreciate you taking the time to let us know about your experience today. Thank you! ? ? ?

## 2021-11-05 LAB — URINE CULTURE

## 2021-11-10 LAB — CYTOLOGY - PAP
Comment: NEGATIVE
Diagnosis: NEGATIVE
Diagnosis: REACTIVE
High risk HPV: NEGATIVE

## 2022-04-16 ENCOUNTER — Emergency Department: Payer: BC Managed Care – PPO

## 2022-04-16 ENCOUNTER — Other Ambulatory Visit: Payer: Self-pay

## 2022-04-16 ENCOUNTER — Observation Stay
Admission: EM | Admit: 2022-04-16 | Discharge: 2022-04-17 | Disposition: A | Discharge status: Home / Self Care | Payer: BC Managed Care – PPO | Attending: Internal Medicine | Admitting: Internal Medicine

## 2022-04-16 ENCOUNTER — Observation Stay: Payer: BC Managed Care – PPO

## 2022-04-16 DIAGNOSIS — R079 Chest pain, unspecified: Secondary | ICD-10-CM | POA: Diagnosis present

## 2022-04-16 DIAGNOSIS — G459 Transient cerebral ischemic attack, unspecified: Principal | ICD-10-CM | POA: Diagnosis present

## 2022-04-16 DIAGNOSIS — E663 Overweight: Secondary | ICD-10-CM | POA: Diagnosis present

## 2022-04-16 DIAGNOSIS — Z6825 Body mass index (BMI) 25.0-25.9, adult: Secondary | ICD-10-CM | POA: Diagnosis not present

## 2022-04-16 DIAGNOSIS — E785 Hyperlipidemia, unspecified: Secondary | ICD-10-CM | POA: Diagnosis not present

## 2022-04-16 DIAGNOSIS — I639 Cerebral infarction, unspecified: Secondary | ICD-10-CM | POA: Diagnosis not present

## 2022-04-16 DIAGNOSIS — R7303 Prediabetes: Secondary | ICD-10-CM | POA: Diagnosis present

## 2022-04-16 DIAGNOSIS — Z79899 Other long term (current) drug therapy: Secondary | ICD-10-CM | POA: Diagnosis not present

## 2022-04-16 DIAGNOSIS — R200 Anesthesia of skin: Secondary | ICD-10-CM | POA: Diagnosis present

## 2022-04-16 DIAGNOSIS — I10 Essential (primary) hypertension: Secondary | ICD-10-CM | POA: Diagnosis present

## 2022-04-16 DIAGNOSIS — F419 Anxiety disorder, unspecified: Secondary | ICD-10-CM | POA: Diagnosis present

## 2022-04-16 LAB — CBC
HCT: 41.6 % (ref 36.0–46.0)
Hemoglobin: 14.1 g/dL (ref 12.0–15.0)
MCH: 31.1 pg (ref 26.0–34.0)
MCHC: 33.9 g/dL (ref 30.0–36.0)
MCV: 91.8 fL (ref 80.0–100.0)
Platelets: 210 10*3/uL (ref 150–400)
RBC: 4.53 MIL/uL (ref 3.87–5.11)
RDW: 12.2 % (ref 11.5–15.5)
WBC: 6.2 10*3/uL (ref 4.0–10.5)
nRBC: 0 % (ref 0.0–0.2)

## 2022-04-16 LAB — COMPREHENSIVE METABOLIC PANEL
ALT: 48 U/L — ABNORMAL HIGH (ref 0–44)
AST: 35 U/L (ref 15–41)
Albumin: 4.1 g/dL (ref 3.5–5.0)
Alkaline Phosphatase: 85 U/L (ref 38–126)
Anion gap: 10 (ref 5–15)
BUN: 20 mg/dL (ref 6–20)
CO2: 24 mmol/L (ref 22–32)
Calcium: 9.2 mg/dL (ref 8.9–10.3)
Chloride: 106 mmol/L (ref 98–111)
Creatinine, Ser: 0.83 mg/dL (ref 0.44–1.00)
GFR, Estimated: >60 mL/min (ref >60)
Glucose, Bld: 113 mg/dL — ABNORMAL HIGH (ref 70–99)
Potassium: 3.8 mmol/L (ref 3.5–5.1)
Sodium: 140 mmol/L (ref 135–145)
Total Bilirubin: 0.8 mg/dL (ref 0.3–1.2)
Total Protein: 7.6 g/dL (ref 6.5–8.1)

## 2022-04-16 LAB — PROTIME-INR
INR: 0.9 (ref 0.8–1.2)
Prothrombin Time: 12.5 seconds (ref 11.4–15.2)

## 2022-04-16 LAB — TROPONIN I (HIGH SENSITIVITY)
Troponin I (High Sensitivity): <2 ng/L (ref <18)
Troponin I (High Sensitivity): <2 ng/L (ref <18)

## 2022-04-16 LAB — DIFFERENTIAL
Abs Immature Granulocytes: 0.06 10*3/uL (ref 0.00–0.07)
Basophils Absolute: 0 10*3/uL (ref 0.0–0.1)
Basophils Relative: 0 %
Eosinophils Absolute: 0.1 10*3/uL (ref 0.0–0.5)
Eosinophils Relative: 1 %
Immature Granulocytes: 1 %
Lymphocytes Relative: 23 %
Lymphs Abs: 1.4 10*3/uL (ref 0.7–4.0)
Monocytes Absolute: 0.5 10*3/uL (ref 0.1–1.0)
Monocytes Relative: 8 %
Neutro Abs: 4.1 10*3/uL (ref 1.7–7.7)
Neutrophils Relative %: 67 %

## 2022-04-16 LAB — APTT: aPTT: 22 seconds — ABNORMAL LOW (ref 24–36)

## 2022-04-16 LAB — ETHANOL: Alcohol, Ethyl (B): <10 mg/dL (ref <10)

## 2022-04-16 LAB — CBG MONITORING, ED: Glucose-Capillary: 106 mg/dL — ABNORMAL HIGH (ref 70–99)

## 2022-04-16 MED ORDER — STROKE: EARLY STAGES OF RECOVERY BOOK: Freq: Once | Status: AC

## 2022-04-16 MED ORDER — ASPIRIN 81 MG PO TBEC
81.0000 mg | DELAYED_RELEASE_TABLET | Freq: Every day | ORAL | Status: DC
  Administered 2022-04-16 – 2022-04-17 (×2): 81 mg via ORAL
  Filled 2022-04-16 (×2): qty 1

## 2022-04-16 MED ORDER — CLOPIDOGREL BISULFATE 75 MG PO TABS
75.0000 mg | ORAL_TABLET | Freq: Every day | ORAL | Status: DC
  Administered 2022-04-17: 75 mg via ORAL
  Filled 2022-04-16: qty 1

## 2022-04-16 MED ORDER — SODIUM CHLORIDE 0.9% FLUSH
3.0000 mL | Freq: Once | INTRAVENOUS | Status: AC
  Administered 2022-04-16: 3 mL via INTRAVENOUS

## 2022-04-16 MED ORDER — CLOPIDOGREL BISULFATE 75 MG PO TABS
300.0000 mg | ORAL_TABLET | Freq: Once | ORAL | Status: AC
  Administered 2022-04-16: 300 mg via ORAL
  Filled 2022-04-16: qty 4

## 2022-04-16 MED ORDER — ACETAMINOPHEN 650 MG RE SUPP: 650.0000 mg | RECTAL | Status: DC | PRN

## 2022-04-16 MED ORDER — ACETAMINOPHEN 160 MG/5ML PO SOLN: 650.0000 mg | ORAL | Status: DC | PRN

## 2022-04-16 MED ORDER — ACETAMINOPHEN 325 MG PO TABS
650.0000 mg | ORAL_TABLET | ORAL | Status: DC | PRN
  Administered 2022-04-17: 650 mg via ORAL
  Filled 2022-04-16: qty 2

## 2022-04-16 NOTE — Assessment & Plan Note (Signed)
TIA.  MRI negative.  Will discuss with neurology on CT findings.  Aspirin and Plavix x 3 weeks, then aspirin only. treating hyperlipidemia as below.  Noted prediabetic.  Patient does not smoke.  Started treatment for hypertension.  See below.  Cleared already by speech and PT/OT.  CT angiogram of head and neck as well as echocardiogram completely normal.

## 2022-04-16 NOTE — Plan of Care (Signed)
  Problem: Education: Goal: Knowledge of disease or condition will improve Outcome: Progressing Goal: Knowledge of secondary prevention will improve (MUST DOCUMENT ALL) Outcome: Progressing Goal: Knowledge of patient specific risk factors will improve (Mark N/A or DELETE if not current risk factor) Outcome: Progressing   Problem: Ischemic Stroke/TIA Tissue Perfusion: Goal: Complications of ischemic stroke/TIA will be minimized Outcome: Progressing   Problem: Coping: Goal: Will verbalize positive feelings about self Outcome: Progressing Goal: Will identify appropriate support needs Outcome: Progressing   

## 2022-04-16 NOTE — ED Triage Notes (Signed)
Pt to ED with husband POV from church, suddenly became dizzy and diaphoretic and felt like both arms were tingling, onset about 45 min ago. Chest felt "tight", not anymore. R arm feels "different" than L arm on stroke screen. Smile appears slightly asymmetrical with R side lower than L. Pt is alert, oriented.   CBG 106. Not dizzy at this time. Motor strength and grip is equal bilaterally.  First nurse informed, calling code stroke.

## 2022-04-16 NOTE — Progress Notes (Signed)
   04/16/22 1400  Clinical Encounter Type  Visited With Patient and family together  Visit Type Initial;Code  Referral From Physician  Consult/Referral To Chaplain  Spiritual Encounters  Spiritual Needs Prayer   Chaplain responded to Code Strike to provide compassion presence, care, and prayer for patient and spouse.

## 2022-04-16 NOTE — Progress Notes (Signed)
Telestroke Note   1318: Code stroke cart activated at this time. Patient already taken and returned from CT at time of stroke cart activation.   1321: Dr. Amada Jupiter paged at this time.   1327: Dr. Amada Jupiter arrived at patient's bedside at this time.   1328: Dr. Amada Jupiter performing neuro assessment at this time.   -Derrill Kay Telestroke RN

## 2022-04-16 NOTE — ED Provider Notes (Signed)
[facility] Provider Note    Event Date/Time   First MD Initiated Contact with Patient 04/16/22 1257     (approximate)   History   Tingling and Numbness   HPI  Rachel Colon is a 56 y.o. female who was at church about 45 minutes prior to arrival.  She developed numbness in her right arm come dizzy and sweaty chest seem to feel tight as well.  She developed droopy right side of the face around reportedly her sinus that her speech was a little slurry.  Slurred speech is resolved.  The droopy face is still present and her husband confirms it.      Physical Exam   Triage Vital Signs: ED Triage Vitals  Enc Vitals Group     BP 04/16/22 1309 (!) 151/96     Pulse Rate 04/16/22 1309 83     Resp 04/16/22 1309 18     Temp --      Temp src --      SpO2 04/16/22 1309 99 %     Weight 04/16/22 1248 160 lb (72.6 kg)     Height 04/16/22 1248 5\' 6"  (1.676 m)     Head Circumference --      Peak Flow --      Pain Score 04/16/22 1248 0     Pain Loc --      Pain Edu? --      Excl. in GC? --     Most recent vital signs: Vitals:   04/16/22 1309 04/16/22 1326  BP: (!) 151/96   Pulse: 83   Resp: 18   Temp:  98.1 F (36.7 C)  SpO2: 99%      General: Awake, no distress.  Head normocephalic atraumatic there is some droopiness on the right side of the face. Eyes pupils equal round reactive extraocular movements intact visual fields by confrontation seem to be normal. CV:  Good peripheral perfusion.  Regular rate and rhythm no audible murmurs Resp:  Normal effort.  Lungs are clear Abd:  No distention.  Soft and nontender Neuro: Cranial nerves II through XII are intact except for the droopy right side of the face.  Rapid alternating movements in the hands are normal heel-to-shin is normal bilaterally finger-nose is somewhat slow bilaterally.  Motor strength seems to be 5/5 throughout patient reports numbness and tingling in both hands and arms the right greater  than the left now.  Uncertain if there is tingling on the face or not on testing.   ED Results / Procedures / Treatments   Labs (all labs ordered are listed, but only abnormal results are displayed) Labs Reviewed  APTT - Abnormal; Notable for the following components:      Result Value   aPTT 22 (*)    All other components within normal limits  COMPREHENSIVE METABOLIC PANEL - Abnormal; Notable for the following components:   Glucose, Bld 113 (*)    ALT 48 (*)    All other components within normal limits  CBG MONITORING, ED - Abnormal; Notable for the following components:   Glucose-Capillary 106 (*)    All other components within normal limits  PROTIME-INR  CBC  DIFFERENTIAL  ETHANOL  CBG MONITORING, ED  I-STAT CREATININE, ED  POC URINE PREG, ED  TROPONIN I (HIGH SENSITIVITY)     EKG  EKG read interpreted by me shows normal sinus rhythm rate of 88 normal axis no acute ST-T changes there are no old EKGs  to come to pair 2.   RADIOLOGY CT of the head read by radiology reviewed and interpreted by me does not show any acute stroke but there are some patchy densities in the white matter that could be chronic white matter changes or possibly demyelinating disease.  We will get neurology to see the patient and consider MRI afterwards.   PROCEDURES:  Critical Care performed:   Procedures   MEDICATIONS ORDERED IN ED: Medications  sodium chloride flush (NS) 0.9 % injection 3 mL (has no administration in time range)     IMPRESSION / MDM / ASSESSMENT AND PLAN / ED COURSE  I reviewed the triage vital signs and the nursing notes.  Differential diagnosis includes, but is not limited to, TIA or CVA.  With the chest tightness is possible she also could have had some sort of anginal equivalent.  Neurology feels this may have been an adrenergic surge causing the symptoms.  Patient's presentation is most consistent with acute presentation with potential threat to life or bodily  function.  The patient is on the cardiac monitor to evaluate for evidence of arrhythmia and/or significant heart rate changes.  None have been seen    FINAL CLINICAL IMPRESSION(S) / ED DIAGNOSES   Final diagnoses:  Cerebrovascular accident (CVA), unspecified mechanism (HCC)     Rx / DC Orders   ED Discharge Orders     None        Note:  This document was prepared using Dragon voice recognition software and may include unintentional dictation errors.   Arnaldo Natal, MD 04/16/22 1340

## 2022-04-16 NOTE — Plan of Care (Signed)
  Problem: Education: Goal: Knowledge of disease or condition will improve Outcome: Progressing Goal: Knowledge of secondary prevention will improve (MUST DOCUMENT ALL) Outcome: Progressing Goal: Knowledge of patient specific risk factors will improve Rachel Colon N/A or DELETE if not current risk factor) Outcome: Progressing   Problem: Health Behavior/Discharge Planning: Goal: Goals will be collaboratively established with patient/family Outcome: Progressing   Problem: Education: Goal: Knowledge of General Education information will improve Description: Including pain rating scale, medication(s)/side effects and non-pharmacologic comfort measures Outcome: Progressing   Problem: Safety: Goal: Ability to remain free from injury will improve Outcome: Progressing

## 2022-04-16 NOTE — Consult Note (Signed)
Neurology Consultation Reason for Consult: Code stroke Referring Physician: Janit Bern  CC: Right-sided numbness  History is obtained from: Patient  HPI: Rachel Colon is a 56 y.o. female who was in her normal state of health until 11:45 AM.  She was in church when she suddenly became numb on the right side and felt vertiginous.  She also became quite nauseated at the same time.  Since that time, her symptoms have improved, but she still had some numbness on arrival.  Code stroke was activated, and on my exam, her symptoms did continue to improve but she still did have some mild right facial weakness.   LKW: 11:45 AM tnk given?: no, mild symptoms   Past Medical History:  Diagnosis Date   Breast cyst 02/2005   bilateral   H/O cold sores    History of kidney stones      Family History  Problem Relation Age of Onset   Hypertension Father    Heart attack Maternal Grandmother 102   Breast cancer Neg Hx      Social History:  reports that she has never smoked. She has never used smokeless tobacco. She reports current alcohol use. She reports that she does not use drugs.   Exam: Current vital signs: BP (!) 151/96 (BP Location: Right Arm)   Pulse 83   Temp 98.1 F (36.7 C) (Oral)   Resp 18   Ht 5\' 6"  (1.676 m)   Wt 72.6 kg   SpO2 99%   BMI 25.82 kg/m  Vital signs in last 24 hours: Temp:  [98.1 F (36.7 C)] 98.1 F (36.7 C) (12/10 1326) Pulse Rate:  [83] 83 (12/10 1309) Resp:  [18] 18 (12/10 1309) BP: (151)/(96) 151/96 (12/10 1309) SpO2:  [99 %] 99 % (12/10 1309) Weight:  [72.6 kg] 72.6 kg (12/10 1248)   Physical Exam  Constitutional: Appears well-developed and well-nourished.  Psych: Affect appropriate to situation Eyes: No scleral injection HENT: No OP obstruction MSK: no joint deformities.  Cardiovascular: Normal rate and regular rhythm.  Respiratory: Effort normal, non-labored breathing GI: Soft.  No distension. There is no tenderness.  Skin:  WDI  Neuro: Mental Status: Patient is awake, alert, oriented to person, place, month, year, and situation. Patient is able to give a clear and coherent history. No signs of aphasia or neglect Cranial Nerves: II: Visual Fields are full. Pupils are equal, round, and reactive to light.   III,IV, VI: EOMI without ptosis or diploplia.  V: Facial sensation is symmetric to temperature VII: Facial movement with right facial weakness VIII: hearing is intact to voice X: Uvula elevates symmetrically XI: Shoulder shrug is symmetric. XII: tongue is midline without atrophy or fasciculations.  Motor: Tone is normal. Bulk is normal. 5/5 strength was present in all four extremities.  Sensory: Sensation is symmetric to light touch and temperature in the arms and legs. Cerebellar: FNF and HKS are intact bilaterally Gait: Stable casual gait  I have reviewed labs in epic and the results pertinent to this consultation are: Glucose 113  I have reviewed the images obtained: CT head - negative  Impression: 56 year old female with new onset right sided numbness, vertigo, facial droop that is mostly improved.  At this point, her symptoms are mild and I would not recommend IV tenecteplase.  If she were to worsen before 4:15 PM, she still could be a candidate for thrombolytics, but I suspect she will just need admission for secondary risk factor modification.  Recommendations: - HgbA1c, fasting  lipid panel - MRI of the brain without contrast - Frequent neuro checks - Echocardiogram - MRA head and neck - Prophylactic therapy-Antiplatelet med: Aspirin - dose 81mg  and plavix 75mg  daily after 300mg  load  - Risk factor modification - Telemetry monitoring - PT consult, OT consult, Speech consult    , MD [facility] [phone]  If 7pm- 7am, please page neurology on call as listed in AMION.

## 2022-04-16 NOTE — ED Notes (Signed)
Tele neuro activated. Currently waiting for neurologist to assess pt.

## 2022-04-16 NOTE — H&P (Signed)
History and Physical    Patient: Rachel Colon GQQ:[id number] DOB: 06-Aug-1965 DOA: 04/16/2022 DOS: the patient was seen and examined on 04/16/2022 PCP: [facility]  Patient coming from: Home  Chief Complaint:  Chief Complaint  Patient presents with   Tingling   Numbness   HPI: Rachel Colon is a 56 y.o. female with no significant past medical history who presents to the ER via private vehicle for evaluation after she felt diaphoretic and dizzy while at church with numbness and tingling involving both arms (Rt > Lt) associated with nausea and vomiting.  She also complained of chest tightness and said she had a similar sensation 2 days prior to this admission. Patient states that she was in her usual state of health and was in church with her family when all of a sudden she felt very diaphoretic and dizzy with associated nausea.  She also noted numbness in both arms but more in her right arm and family noted facial asymmetry (Rt > Lt).  She was immediately rushed to the ER for evaluation and a code stroke was called. She denies having any fever, no chills, no cough, no abdominal pain, no urinary symptoms, no changes in her bowel habits, no blurred vision, no lower extremity swelling, no difficulty swallowing, no double vision, no focal deficit. She was seen in the ER by neurologist and had initial CT scan of the head without contrast showed scattered regions of low-density within the white matter. These could indicate chronic small vessel ischemic changes or evidence of demyelinating disease. Aspects is 10. Twelve-lead EKG reviewed by me shows normal sinus rhythm with no acute ST or T wave changes. Patient will be admitted to the hospital for further evaluation.   Review of Systems: As mentioned in the history of present illness. All other systems reviewed and are negative. Past Medical History:  Diagnosis Date   Breast cyst 02/2005   bilateral   H/O cold sores     History of kidney stones    Past Surgical History:  Procedure Laterality Date   COLONOSCOPY  06/16/2016   COLPOSCOPY  1997   CRYOTHERAPY  1997   Social History:  reports that she has never smoked. She has never used smokeless tobacco. She reports current alcohol use. She reports that she does not use drugs.  Allergies  Allergen Reactions   Penicillins Other (See Comments)    infancy     Family History  Problem Relation Age of Onset   Hypertension Father    Heart attack Maternal Grandmother 69   Breast cancer Neg Hx     Prior to Admission medications   Medication Sig Start Date End Date Taking? Authorizing Provider  fexofenadine (ALLEGRA) 60 MG tablet Take 60 mg by mouth 2 (two) times daily.   Yes [provider]  VITAMIN D PO Take by mouth.   Yes [provider]  acyclovir (ZOVIRAX) 400 MG tablet Take 1 tablet (400 mg total) by mouth 5 (five) times daily. For 5 days prn sx Patient not taking: Reported on 04/16/2022 11/03/21   Copland, Ilona Sorrel, PA-C  esomeprazole (NEXIUM) 40 MG capsule Take 40 mg by mouth 2 (two) times daily before a meal.    [provider]  etodolac (LODINE) 500 MG tablet Take 500 mg by mouth 2 (two) times daily. Patient not taking: Reported on 04/16/2022 04/06/22   [provider]  meloxicam (MOBIC) 15 MG tablet Take 1 tablet by mouth daily. Patient  not taking: Reported on 04/16/2022    [provider]  RYALTRIS [phone] MCG/ACT SUSP Place 1 spray into both nostrils in the morning and at bedtime. Patient not taking: Reported on 04/16/2022    [provider]  traZODone (DESYREL) 50 MG tablet Take 50-100 mg by mouth at bedtime as needed. Patient not taking: Reported on 04/16/2022 10/11/21   [provider]    Physical Exam: Vitals:   04/16/22 1400 04/16/22 1430 04/16/22 1500 04/16/22 1530  BP: (!) 160/98 (!) 144/85 (!) 131/96 (!) 154/93  Pulse: 89 78 83 84  Resp: (!) 22 19 20  (!) 28  Temp:       TempSrc:      SpO2: 98% 98% 99% 99%  Weight:      Height:       Physical Exam Vitals and nursing note reviewed.  Constitutional:      Appearance: Normal appearance.     Comments: Right facial droop.  HENT:     Head: Normocephalic and atraumatic.     Nose: Nose normal.     Mouth/Throat:     Mouth: Mucous membranes are moist.  Eyes:     Conjunctiva/sclera: Conjunctivae normal.  Cardiovascular:     Rate and Rhythm: Normal rate and regular rhythm.  Pulmonary:     Effort: Pulmonary effort is normal.     Breath sounds: Normal breath sounds.  Abdominal:     General: Abdomen is flat. Bowel sounds are normal.     Palpations: Abdomen is soft.  Musculoskeletal:        General: Normal range of motion.     Cervical back: Normal range of motion and neck supple.  Skin:    General: Skin is warm and dry.  Neurological:     Mental Status: She is alert and oriented to person, place, and time.     Comments: Able to move all extremities.  Facial asymmetry (Rt > Lt)  Psychiatric:        Mood and Affect: Mood normal.        Behavior: Behavior normal.     Data Reviewed Relevant notes from primary care and specialist visits, past discharge summaries as available in EHR, including Care Everywhere. Prior diagnostic testing as pertinent to current admission diagnoses Updated medications and problem lists for reconciliation ED course, including vitals, labs, imaging, treatment and response to treatment Triage notes, nursing and pharmacy notes and ED provider's notes Notable results as noted in HPI Labs reviewed.  Troponin less than 2, sodium 140, potassium 3.8, chloride 106, bicarb 24, glucose 113, BUN 20, creatinine 0.83, calcium 9.2, total protein 7.6, albumin 4.1, AST 35, ALT 48, alkaline phosphatase 85, total bilirubin 0.8, PT 12.5, INR 0.9, white count 6.2, hemoglobin 14.1, hematocrit 41.6, platelet count 210 There are no new results to review at this time.  Assessment and Plan: * Acute  CVA (cerebrovascular accident) [facility]) Patient presents to the emergency room for evaluation of sudden numbness involving both upper extremities (Rt > Lt) associated with a right facial droop.  Symptoms have improved but not completely resolved Will obtain MRI of the brain without contrast to rule out an acute infarct Obtain 2D echocardiogram to assess LVEF and rule out LV thrombus We will request PT/OT/ST consult Allow for permissive hypertension Consult neurology      Advance Care Planning:   Code Status: Full Code   Consults: Neurology  Family Communication: Greater than 50% of time was spent discussing patient's condition and plan of care  with her and her family at the bedside.  All questions and concerns have been addressed.  They verbalized understanding and agree with the plan.  Severity of Illness: The appropriate patient status for this patient is OBSERVATION. Observation status is judged to be reasonable and necessary in order to provide the required intensity of service to ensure the patient's safety. The patient's presenting symptoms, physical exam findings, and initial radiographic and laboratory data in the context of their medical condition is felt to place them at decreased risk for further clinical deterioration. Furthermore, it is anticipated that the patient will be medically stable for discharge from the hospital within 2 midnights of admission.   Author: Collier Bullock, MD 04/16/2022 3:43 PM  For on call review [email or web address].

## 2022-04-17 ENCOUNTER — Encounter: Payer: Self-pay | Admitting: Internal Medicine

## 2022-04-17 ENCOUNTER — Observation Stay: Payer: BC Managed Care – PPO

## 2022-04-17 ENCOUNTER — Observation Stay (HOSPITAL_BASED_OUTPATIENT_CLINIC_OR_DEPARTMENT_OTHER)
Admit: 2022-04-17 | Discharge: 2022-04-17 | Disposition: A | Discharge status: Home / Self Care | Payer: BC Managed Care – PPO | Attending: Internal Medicine | Admitting: Internal Medicine

## 2022-04-17 DIAGNOSIS — E663 Overweight: Secondary | ICD-10-CM | POA: Diagnosis not present

## 2022-04-17 DIAGNOSIS — G459 Transient cerebral ischemic attack, unspecified: Principal | ICD-10-CM

## 2022-04-17 DIAGNOSIS — R079 Chest pain, unspecified: Secondary | ICD-10-CM | POA: Diagnosis present

## 2022-04-17 DIAGNOSIS — R7303 Prediabetes: Secondary | ICD-10-CM | POA: Diagnosis not present

## 2022-04-17 DIAGNOSIS — F419 Anxiety disorder, unspecified: Secondary | ICD-10-CM

## 2022-04-17 DIAGNOSIS — E7841 Elevated Lipoprotein(a): Secondary | ICD-10-CM | POA: Diagnosis not present

## 2022-04-17 DIAGNOSIS — E785 Hyperlipidemia, unspecified: Secondary | ICD-10-CM | POA: Diagnosis present

## 2022-04-17 DIAGNOSIS — I10 Essential (primary) hypertension: Secondary | ICD-10-CM | POA: Diagnosis present

## 2022-04-17 LAB — ECHOCARDIOGRAM COMPLETE
AR max vel: 3.1 cm2
AV Area VTI: 3.14 cm2
AV Area mean vel: 3.14 cm2
AV Mean grad: 3 mmHg
AV Peak grad: 5.8 mmHg
Ao pk vel: 1.2 m/s
Area-P 1/2: 5.93 cm2
Height: 66 in
S' Lateral: 2 cm
Weight: 2560 oz

## 2022-04-17 LAB — HEMOGLOBIN A1C
Hgb A1c MFr Bld: 5.9 % — ABNORMAL HIGH (ref 4.8–5.6)
Mean Plasma Glucose: 123 mg/dL

## 2022-04-17 LAB — LIPID PANEL
Cholesterol: 205 mg/dL — ABNORMAL HIGH (ref 0–200)
HDL: 64 mg/dL (ref >40)
LDL Cholesterol: 126 mg/dL — ABNORMAL HIGH (ref 0–99)
Total CHOL/HDL Ratio: 3.2 RATIO
Triglycerides: 76 mg/dL (ref <150)
VLDL: 15 mg/dL (ref 0–40)

## 2022-04-17 LAB — HIV ANTIBODY (ROUTINE TESTING W REFLEX): HIV Screen 4th Generation wRfx: NONREACTIVE

## 2022-04-17 LAB — PROCALCITONIN: Procalcitonin: <0.1 ng/mL

## 2022-04-17 MED ORDER — CARVEDILOL 6.25 MG PO TABS: 6.2500 mg | ORAL_TABLET | Freq: Two times a day (BID) | ORAL | 2 refills | Status: AC

## 2022-04-17 MED ORDER — IOHEXOL 350 MG/ML SOLN
75.0000 mL | Freq: Once | INTRAVENOUS | Status: AC | PRN
  Administered 2022-04-17: 75 mL via INTRAVENOUS

## 2022-04-17 MED ORDER — ATORVASTATIN CALCIUM 40 MG PO TABS: 40.0000 mg | ORAL_TABLET | Freq: Every day | ORAL | 2 refills | Status: AC

## 2022-04-17 MED ORDER — CLOPIDOGREL BISULFATE 75 MG PO TABS: 75.0000 mg | ORAL_TABLET | Freq: Every day | ORAL | 0 refills | Status: AC

## 2022-04-17 MED ORDER — CARVEDILOL 6.25 MG PO TABS: 6.2500 mg | ORAL_TABLET | Freq: Two times a day (BID) | ORAL | Status: DC

## 2022-04-17 MED ORDER — ASPIRIN 81 MG PO TBEC: 81.0000 mg | DELAYED_RELEASE_TABLET | Freq: Every day | ORAL | 12 refills | Status: AC

## 2022-04-17 MED ORDER — PERFLUTREN LIPID MICROSPHERE
1.0000 mL | INTRAVENOUS | Status: AC | PRN
  Administered 2022-04-17: 2 mL via INTRAVENOUS

## 2022-04-17 MED ORDER — ATORVASTATIN CALCIUM 20 MG PO TABS
40.0000 mg | ORAL_TABLET | Freq: Every day | ORAL | Status: DC
  Administered 2022-04-17: 40 mg via ORAL
  Filled 2022-04-17: qty 2

## 2022-04-17 NOTE — Hospital Course (Addendum)
Patient is a 56 year old female with past medical history only of kidney stones who presented to the emergency room on early afternoon of 12/10 after she felt diaphoretic and dizzy that morning at church with numbness and tingling in both arms, chest tightness and nausea/vomiting plus right facial droop.  Patient had previously been in her usual state of good health without any issues prior.  In the emergency room, initial CT scan of head noted scattered regions of low-density within white matter indicating possible chronic small vessel ischemic changes versus evidence of demyelinating disease.  Neurology consulted.  Patient admitted to the hospitalist service mid afternoon, symptoms had improved and by that night had fully resolved.  MRI of head done that evening negative for CVA.

## 2022-04-17 NOTE — Plan of Care (Signed)

## 2022-04-17 NOTE — Plan of Care (Signed)
Problem: Education: Goal: Knowledge of disease or condition will improve 04/17/2022 1658 by Larose Kells, RN Outcome: Adequate for Discharge 04/17/2022 1556 by Larose Kells, RN Outcome: Progressing Goal: Knowledge of secondary prevention will improve (MUST DOCUMENT ALL) 04/17/2022 1658 by Larose Kells, RN Outcome: Adequate for Discharge 04/17/2022 1556 by Larose Kells, RN Outcome: Progressing Goal: Knowledge of patient specific risk factors will improve Loraine Leriche N/A or DELETE if not current risk factor) 04/17/2022 1658 by Larose Kells, RN Outcome: Adequate for Discharge 04/17/2022 1556 by Larose Kells, RN Outcome: Progressing   Problem: Ischemic Stroke/TIA Tissue Perfusion: Goal: Complications of ischemic stroke/TIA will be minimized 04/17/2022 1658 by Larose Kells, RN Outcome: Adequate for Discharge 04/17/2022 1556 by Larose Kells, RN Outcome: Progressing   Problem: Coping: Goal: Will verbalize positive feelings about self 04/17/2022 1658 by Larose Kells, RN Outcome: Adequate for Discharge 04/17/2022 1556 by Larose Kells, RN Outcome: Progressing Goal: Will identify appropriate support needs 04/17/2022 1658 by Larose Kells, RN Outcome: Adequate for Discharge 04/17/2022 1556 by Larose Kells, RN Outcome: Progressing   Problem: Health Behavior/Discharge Planning: Goal: Ability to manage health-related needs will improve 04/17/2022 1658 by Larose Kells, RN Outcome: Adequate for Discharge 04/17/2022 1556 by Larose Kells, RN Outcome: Progressing Goal: Goals will be collaboratively established with patient/family 04/17/2022 1658 by Larose Kells, RN Outcome: Adequate for Discharge 04/17/2022 1556 by Larose Kells, RN Outcome: Progressing   Problem: Self-Care: Goal: Ability to participate in self-care as condition permits will improve 04/17/2022 1658 by Larose Kells, RN Outcome: Adequate for  Discharge 04/17/2022 1556 by Larose Kells, RN Outcome: Progressing Goal: Verbalization of feelings and concerns over difficulty with self-care will improve 04/17/2022 1658 by Larose Kells, RN Outcome: Adequate for Discharge 04/17/2022 1556 by Larose Kells, RN Outcome: Progressing Goal: Ability to communicate needs accurately will improve 04/17/2022 1658 by Larose Kells, RN Outcome: Adequate for Discharge 04/17/2022 1556 by Larose Kells, RN Outcome: Progressing   Problem: Nutrition: Goal: Risk of aspiration will decrease 04/17/2022 1658 by Larose Kells, RN Outcome: Adequate for Discharge 04/17/2022 1556 by Larose Kells, RN Outcome: Progressing Goal: Dietary intake will improve 04/17/2022 1658 by Larose Kells, RN Outcome: Adequate for Discharge 04/17/2022 1556 by Larose Kells, RN Outcome: Progressing   Problem: Education: Goal: Knowledge of General Education information will improve Description: Including pain rating scale, medication(s)/side effects and non-pharmacologic comfort measures 04/17/2022 1658 by Larose Kells, RN Outcome: Adequate for Discharge 04/17/2022 1556 by Larose Kells, RN Outcome: Progressing   Problem: Health Behavior/Discharge Planning: Goal: Ability to manage health-related needs will improve 04/17/2022 1658 by Larose Kells, RN Outcome: Adequate for Discharge 04/17/2022 1556 by Larose Kells, RN Outcome: Progressing   Problem: Clinical Measurements: Goal: Ability to maintain clinical measurements within normal limits will improve 04/17/2022 1658 by Larose Kells, RN Outcome: Adequate for Discharge 04/17/2022 1556 by Larose Kells, RN Outcome: Progressing Goal: Will remain free from infection 04/17/2022 1658 by Larose Kells, RN Outcome: Adequate for Discharge 04/17/2022 1556 by Larose Kells, RN Outcome: Progressing Goal: Diagnostic test results will  improve 04/17/2022 1658 by Larose Kells, RN Outcome: Adequate for Discharge 04/17/2022 1556 by Larose Kells, RN Outcome: Progressing Goal: Respiratory complications will improve 04/17/2022 1658 by Larose Kells, RN Outcome: Adequate for Discharge 04/17/2022 1556 by Larose Kells, RN Outcome: Progressing Goal: Cardiovascular complication will be avoided  04/17/2022 1658 by Larose Kells, RN Outcome: Adequate for Discharge 04/17/2022 1556 by Larose Kells, RN Outcome: Progressing   Problem: Activity: Goal: Risk for activity intolerance will decrease 04/17/2022 1658 by Larose Kells, RN Outcome: Adequate for Discharge 04/17/2022 1556 by Larose Kells, RN Outcome: Progressing   Problem: Nutrition: Goal: Adequate nutrition will be maintained 04/17/2022 1658 by Larose Kells, RN Outcome: Adequate for Discharge 04/17/2022 1556 by Larose Kells, RN Outcome: Progressing   Problem: Coping: Goal: Level of anxiety will decrease 04/17/2022 1658 by Larose Kells, RN Outcome: Adequate for Discharge 04/17/2022 1556 by Larose Kells, RN Outcome: Progressing   Problem: Elimination: Goal: Will not experience complications related to bowel motility 04/17/2022 1658 by Larose Kells, RN Outcome: Adequate for Discharge 04/17/2022 1556 by Larose Kells, RN Outcome: Progressing Goal: Will not experience complications related to urinary retention 04/17/2022 1658 by Larose Kells, RN Outcome: Adequate for Discharge 04/17/2022 1556 by Larose Kells, RN Outcome: Progressing   Problem: Pain Managment: Goal: General experience of comfort will improve 04/17/2022 1658 by Larose Kells, RN Outcome: Adequate for Discharge 04/17/2022 1556 by Larose Kells, RN Outcome: Progressing   Problem: Safety: Goal: Ability to remain free from injury will improve 04/17/2022 1658 by Larose Kells, RN Outcome: Adequate for  Discharge 04/17/2022 1556 by Larose Kells, RN Outcome: Progressing   Problem: Skin Integrity: Goal: Risk for impaired skin integrity will decrease 04/17/2022 1658 by Larose Kells, RN Outcome: Adequate for Discharge 04/17/2022 1556 by Larose Kells, RN Outcome: Progressing

## 2022-04-17 NOTE — Evaluation (Signed)
Physical Therapy Evaluation Patient Details Name: Rachel Colon MRN: [id number] DOB: 1965/06/24 Today's Date: 04/17/2022  History of Present Illness  Pt is a 56 y.o. female with no significant past medical history who presented to the ER via private vehicle for evaluation after she felt diaphoretic and dizzy while at church with numbness and tingling involving both arms (Rt > Lt) associated with nausea and vomiting   Clinical Impression  Pt was pleasant and motivated to participate during the session and put forth good effort throughout.  Pt presented with no deficits in BUE/BLE strength, sensation to light touch/proprioception, or coordination.  No visual or balance deficits observed.  Pt independent with all functional tasks including gait and stair negotiation without AD/rails.  No skilled PT or DME needs identified.  Will complete PT orders at this time but will reassess pt pending a change in status upon receipt of new PT orders.           Recommendations for follow up therapy are one component of a multi-disciplinary discharge planning process, led by the attending physician.  Recommendations may be updated based on patient status, additional functional criteria and insurance authorization.  Follow Up Recommendations No PT follow up      Assistance Recommended at Discharge None  Patient can return home with the following  Assist for transportation    Equipment Recommendations None recommended by PT  Recommendations for Other Services       Functional Status Assessment Patient has not had a recent decline in their functional status     Precautions / Restrictions Precautions Precautions: None Restrictions Weight Bearing Restrictions: No      Mobility  Bed Mobility Overal bed mobility: Independent                  Transfers Overall transfer level: Independent Equipment used: None               General transfer comment: Excellent eccentric and concentric  control and stability    Ambulation/Gait Ambulation/Gait assistance: Independent Gait Distance (Feet): 200 Feet Assistive device: None Gait Pattern/deviations: WFL(Within Functional Limits) Gait velocity: WNL     General Gait Details: Good stability including 90 deg turns and during start/stops, cadence WNL  Stairs Stairs: Yes Stairs assistance: Independent Stair Management: No rails, Forwards, Alternating pattern Number of Stairs: 5 General stair comments: Excellent eccentric and concentric control and stability with reciprocal pattern and no hand rails  Wheelchair Mobility    Modified Rankin (Stroke Patients Only)       Balance Overall balance assessment: Independent                                           Pertinent Vitals/Pain Pain Assessment Pain Assessment: No/denies pain    Home Living Family/patient expects to be discharged to:: Private residence Living Arrangements: Spouse/significant other Available Help at Discharge: Family;Available PRN/intermittently Type of Home: House Home Access: Stairs to enter Entrance Stairs-Rails: None Entrance Stairs-Number of Steps: 3   Home Layout: Two level Home Equipment: None Additional Comments: Husband works during the day    Prior Function Prior Level of Function : Independent/Modified Independent;Driving             Mobility Comments: Ind amb community distances without an AD, no fall history, active including with grandchild ADLs Comments: Ind with ADLs, retired     Higher education careers adviser  Dominant Hand: Right    Extremity/Trunk Assessment   Upper Extremity Assessment Upper Extremity Assessment: Overall WFL for tasks assessed;RUE deficits/detail;LUE deficits/detail RUE Deficits / Details: Strength WNL RUE Sensation: WNL RUE Coordination: WNL LUE Deficits / Details: Strength WNL LUE Sensation: WNL LUE Coordination: WNL    Lower Extremity Assessment Lower Extremity Assessment:  Overall WFL for tasks assessed;RLE deficits/detail;LLE deficits/detail RLE Deficits / Details: Strength WNL RLE Sensation: WNL RLE Coordination: WNL LLE Deficits / Details: Strength WNL LLE Sensation: WNL LLE Coordination: WNL    Cervical / Trunk Assessment Cervical / Trunk Assessment: Normal  Communication   Communication: No difficulties  Cognition Arousal/Alertness: Awake/alert Behavior During Therapy: WFL for tasks assessed/performed Overall Cognitive Status: Within Functional Limits for tasks assessed                                          General Comments General comments (skin integrity, edema, etc.): Bilateral SLS time > 10 sec    Exercises Other Exercises Other Exercises: Pt education provided on signs/symptoms of CVA and importance of rapid medical attention   Assessment/Plan    PT Assessment Patient does not need any further PT services  PT Problem List         PT Treatment Interventions      PT Goals (Current goals can be found in the Care Plan section)  Acute Rehab PT Goals PT Goal Formulation: All assessment and education complete, DC therapy    Frequency       Co-evaluation               AM-PAC PT "6 Clicks" Mobility  Outcome Measure Help needed turning from your back to your side while in a flat bed without using bedrails?: None Help needed moving from lying on your back to sitting on the side of a flat bed without using bedrails?: None Help needed moving to and from a bed to a chair (including a wheelchair)?: None Help needed standing up from a chair using your arms (e.g., wheelchair or bedside chair)?: None Help needed to walk in hospital room?: None Help needed climbing 3-5 steps with a railing? : None 6 Click Score: 24    End of Session   Activity Tolerance: Patient tolerated treatment well Patient left: in chair;with call bell/phone within reach;with family/visitor present Nurse Communication: Mobility status PT  Visit Diagnosis: Other symptoms and signs involving the nervous system (P82.423)    Time: 5361-4431 PT Time Calculation (min) (ACUTE ONLY): 22 min   Charges:   PT Evaluation $PT Eval Low Complexity: 1 Low         D. Scott Abbee Cremeens PT, DPT 04/17/22, 11:05 AM

## 2022-04-17 NOTE — Assessment & Plan Note (Addendum)
Fasting lipid panel this morning and notes LDL of 107.  Given that this is likely TIA, target needs to be below 70.  Statin started.  Recommend patient have fasting lipid panel done in 6 months and if still not at target, increase Lipitor to 80 mg

## 2022-04-17 NOTE — Assessment & Plan Note (Signed)
Meets criteria BMI greater than 25 

## 2022-04-17 NOTE — Progress Notes (Signed)
*  PRELIMINARY RESULTS* Echocardiogram 2D Echocardiogram has been performed.  Rachel Colon 04/17/2022, 12:10 PM

## 2022-04-17 NOTE — Progress Notes (Signed)
SLP Cancellation Note  Patient Details Name: Rachel Colon MRN: [id number] DOB: 03-22-1966   Cancelled treatment:       Reason Eval/Treat Not Completed: SLP screened, no needs identified, will sign off (chart reviewed; consulted NSG then met w/ pt/Husband in room.) Pt denied any difficulty swallowing and is currently on a regular diet; tolerates swallowing pills w/ water per NSG.  Pt conversed in conversation w/out expressive/receptive deficits noted; A/O x4 and identified s/s which led her being admitted. Pt denied any speech-language deficits. Speech clear, intelligible. No further skilled ST services indicated as pt appears at her baseline. Pt agreed. NSG to reconsult if any change in status while admitted.      Orinda Kenner, MS, CCC-SLP Speech Language Pathologist Rehab Services; [place] [phone] (ascom) [place] 04/17/2022, 11:48 AM

## 2022-04-17 NOTE — Assessment & Plan Note (Signed)
A1c of 5.9.  Will inform patient, encourage exercise and weight loss which should resolve this issue

## 2022-04-17 NOTE — Discharge Summary (Signed)
Physician Discharge Summary   Patient: Rachel Colon MRN: [id number] DOB: 1966/03/30  Admit date:     04/16/2022  Discharge date: 04/17/22  Discharge Physician: Hollice Espy   PCP: [facility]   Recommendations at discharge:   New medication: Aspirin 81 mg p.o. daily New medication: Plavix 75 mg p.o. daily x 3 weeks Patient advised not to take her Nexium while on Plavix due to interaction which weakens Plavix New medication: Lipitor 40 mg p.o. daily New medication: Coreg 6.25 p.o. twice daily Patient will follow-up with her PCP in the next month  Discharge Diagnoses: Principal Problem:   TIA (transient ischemic attack) Active Problems:   Essential hypertension   Chest pain   Anxiety   Pre-diabetes   Hyperlipidemia   Overweight (BMI 25.0-29.9)  Resolved Problems:   * No resolved hospital problems. [facility] Course: Patient is a 56 year old female with past medical history only of kidney stones who presented to the emergency room on early afternoon of 12/10 after she felt diaphoretic and dizzy that morning at church with numbness and tingling in both arms, chest tightness and nausea/vomiting plus right facial droop.  Patient had previously been in her usual state of good health without any issues prior.  In the emergency room, initial CT scan of head noted scattered regions of low-density within white matter indicating possible chronic small vessel ischemic changes versus evidence of demyelinating disease.  Neurology consulted.  Patient admitted to the hospitalist service mid afternoon, symptoms had improved and by that night had fully resolved.  MRI of head done that evening negative for CVA.  Assessment and Plan: * TIA (transient ischemic attack) TIA.  MRI negative.  Will discuss with neurology on CT findings.  Aspirin and Plavix x 3 weeks, then aspirin only. treating hyperlipidemia as below.  Noted prediabetic.  Patient does not smoke.  Started  treatment for hypertension.  See below.  Cleared already by speech and PT/OT.  CT angiogram of head and neck as well as echocardiogram completely normal.  Essential hypertension When asked about high blood pressure, patient states that her blood pressure does tend to run high when she is at the doctor's office and when checked, they have attributed this to anxiety over seeing a doctor.  Given looking at CVA risk factors, started patient on very low-dose beta-blocker, this should both help with blood pressure and anxiety.  Chest pain Maybe more related to mini stroke.  Does not appear to be cardiac in nature.  Troponins have been completely normal and EKG noted normal sinus rhythm without any chronic findings.  Symptoms resolved in the emergency room.  Anxiety When asked about elevated blood pressures, patient tells me that her blood pressure is high when she is at her PCPs visit and they have attributed this to being anxious seeing a doctor.  Noted that her heart rate seems to run a little bit higher.  She does admit to some anxiety.  Since she is going to start medication for blood pressure as above, putting her on a beta-blocker is likely her best option.  Pre-diabetes A1c of 5.9.  Will inform patient, encourage exercise and weight loss which should resolve this issue  Hyperlipidemia Fasting lipid panel this morning and notes LDL of 107.  Given that this is likely TIA, target needs to be below 70.  Statin started.  Recommend patient have fasting lipid panel done in 6 months and if still not at target, increase Lipitor to  80 mg  Overweight (BMI 25.0-29.9) Meets criteria BMI greater than 25         Consultants: Neurology Procedures performed: Echocardiogram done 12/11: Normal Disposition: Home Diet recommendation:  Discharge Diet Orders (From admission, onward)     Start     Ordered   04/17/22 0000  Diet - low sodium heart healthy        04/17/22 1616           Cardiac  diet DISCHARGE MEDICATION: Allergies as of 04/17/2022       Reactions   Penicillins Other (See Comments)   infancy        Medication List     TAKE these medications    aspirin EC 81 MG tablet Take 1 tablet (81 mg total) by mouth daily. Swallow whole. Start taking on: April 18, 2022   atorvastatin 40 MG tablet Commonly known as: LIPITOR Take 1 tablet (40 mg total) by mouth daily.   carvedilol 6.25 MG tablet Commonly known as: COREG Take 1 tablet (6.25 mg total) by mouth 2 (two) times daily with a meal.   clopidogrel 75 MG tablet Commonly known as: PLAVIX Take 1 tablet (75 mg total) by mouth daily for 20 days. Start taking on: April 18, 2022   esomeprazole 40 MG capsule Commonly known as: NEXIUM Take 40 mg by mouth 2 (two) times daily before a meal.   fexofenadine 60 MG tablet Commonly known as: ALLEGRA Take 60 mg by mouth 2 (two) times daily.   VITAMIN D PO Take by mouth.        Discharge Exam: Filed Weights   04/16/22 1248  Weight: 72.6 kg   General: Alert and oriented x 3, no acute distress Cardiovascular: Regular rate and rhythm, S1-S2 Neuro: Cranial nerves II through XII are intact, no focal deficits  Condition at discharge: good  The results of significant diagnostics from this hospitalization (including imaging, microbiology, ancillary and laboratory) are listed below for reference.   Imaging Studies: CT ANGIO HEAD NECK W WO CM  Result Date: 04/17/2022 CLINICAL DATA:  Diaphoresis, dizziness, numbness and tingling in both arms. EXAM: CT ANGIOGRAPHY HEAD AND NECK TECHNIQUE: Multidetector CT imaging of the head and neck was performed using the standard protocol during bolus administration of intravenous contrast. Multiplanar CT image reconstructions and MIPs were obtained to evaluate the vascular anatomy. Carotid stenosis measurements (when applicable) are obtained utilizing NASCET criteria, using the distal internal carotid diameter as the  denominator. RADIATION DOSE REDUCTION: This exam was performed according to the departmental dose-optimization program which includes automated exposure control, adjustment of the mA and/or kV according to patient size and/or use of iterative reconstruction technique. CONTRAST:  75mL OMNIPAQUE IOHEXOL 350 MG/ML SOLN COMPARISON:  CT head and brain MRI dated 1 day prior FINDINGS: CT HEAD FINDINGS Brain: There is no acute intracranial hemorrhage, extra-axial fluid collection, or acute infarct Parenchymal volume is normal. The ventricles are normal in size. Patchy hypodensity in the supratentorial white matter is unchanged, likely reflecting sequela of chronic small-vessel ischemic change. There is no mass lesion.  There is no mass effect or midline shift. Vascular: See below. Skull: Normal. Negative for fracture or focal lesion. Sinuses/Orbits: The imaged paranasal sinuses are clear. The imaged globes and orbits are unremarkable. Other: None. Review of the MIP images confirms the above findings CTA NECK FINDINGS Aortic arch: The imaged aortic arch is normal. The origins of the major branch vessels are patent. The subclavian arteries are patent to the level imaged.  Right carotid system: The right common, internal, and external carotid arteries are patent, without hemodynamically significant stenosis or occlusion. There is no dissection or aneurysm. Left carotid system: The left common, internal, and external carotid arteries are patent, without hemodynamically significant stenosis or occlusion. There is no dissection or aneurysm. Vertebral arteries: The vertebral arteries are patent, without hemodynamically significant stenosis or occlusion. There is no dissection or aneurysm. Skeleton: There is no acute osseous abnormality or suspicious osseous lesion. There is no visible canal hematoma. Other neck: The soft tissues of the neck are unremarkable. Upper chest: The imaged lung apices are clear. Review of the MIP images  confirms the above findings CTA HEAD FINDINGS Anterior circulation: The intracranial ICAs are normal. The bilateral MCAs are patent, without proximal stenosis or occlusion. The bilateral ACAs are patent, without proximal stenosis or occlusion. The anterior communicating artery is normal. There is no aneurysm or AVM. Posterior circulation: The bilateral V4 segments are patent. The basilar artery is patent. The major cerebellar arteries are patent. The bilateral PCAs are patent, without proximal stenosis or occlusion. There is a prominent left posterior communicating artery with a small left P1 segment. A right posterior communicating artery is not definitely seen. There is no aneurysm or AVM. Venous sinuses: Patent. Anatomic variants: As above. Review of the MIP images confirms the above findings IMPRESSION: 1. Stable noncontrast head CT with no acute intracranial pathology. 2. Normal vasculature of the head and neck. Electronically Signed   By: Lesia Hausen M.D.   On: 04/17/2022 15:21   ECHOCARDIOGRAM COMPLETE  Result Date: 04/17/2022    ECHOCARDIOGRAM REPORT   Patient Name:   Antoria Elmyra Ricks Date of Exam: 04/17/2022 Medical Rec #:  [id number]    Height:       66.0 in Accession #:    [id number]   Weight:       160.0 lb Date of Birth:  02-Dec-1965    BSA:          1.819 m Patient Age:    56 years     BP:           131/87 mmHg Patient Gender: F            HR:           94 bpm. Exam Location:  [facility] Procedure: 2D Echo, Color Doppler, Cardiac Doppler and Intracardiac            Opacification Agent Indications:     I63.9 Stroke  History:         Patient has no prior history of Echocardiogram examinations.                  Signs/Symptoms:Dizziness/Lightheadedness and Chest tightness.  Sonographer:     Humphrey Rolls Referring Phys:  [id number] AGBATA Diagnosing Phys: Lorine Bears MD  Sonographer Comments: Suboptimal apical window. IMPRESSIONS  1. Left ventricular ejection fraction, by estimation, is 55 to 60%. The left  ventricle has normal function. The left ventricle has no regional wall motion abnormalities. Left ventricular diastolic parameters were normal.  2. Right ventricular systolic function is normal. The right ventricular size is normal. Tricuspid regurgitation signal is inadequate for assessing PA pressure.  3. The mitral valve is normal in structure. No evidence of mitral valve regurgitation. No evidence of mitral stenosis.  4. The aortic valve is normal in structure. Aortic valve regurgitation is not visualized. Aortic valve sclerosis/calcification is present, without any evidence of aortic stenosis.  5. The inferior vena  cava is normal in size with greater than 50% respiratory variability, suggesting right atrial pressure of 3 mmHg. FINDINGS  Left Ventricle: Left ventricular ejection fraction, by estimation, is 55 to 60%. The left ventricle has normal function. The left ventricle has no regional wall motion abnormalities. Definity contrast agent was given IV to delineate the left ventricular  endocardial borders. The left ventricular internal cavity size was normal in size. There is no left ventricular hypertrophy. Left ventricular diastolic parameters were normal. Right Ventricle: The right ventricular size is normal. No increase in right ventricular wall thickness. Right ventricular systolic function is normal. Tricuspid regurgitation signal is inadequate for assessing PA pressure. Left Atrium: Left atrial size was normal in size. Right Atrium: Right atrial size was normal in size. Pericardium: There is no evidence of pericardial effusion. Mitral Valve: The mitral valve is normal in structure. No evidence of mitral valve regurgitation. No evidence of mitral valve stenosis. Tricuspid Valve: The tricuspid valve is normal in structure. Tricuspid valve regurgitation is not demonstrated. No evidence of tricuspid stenosis. Aortic Valve: The aortic valve is normal in structure. Aortic valve regurgitation is not  visualized. Aortic valve sclerosis/calcification is present, without any evidence of aortic stenosis. Aortic valve mean gradient measures 3.0 mmHg. Aortic valve peak  gradient measures 5.8 mmHg. Aortic valve area, by VTI measures 3.14 cm. Pulmonic Valve: The pulmonic valve was normal in structure. Pulmonic valve regurgitation is not visualized. No evidence of pulmonic stenosis. Aorta: The aortic root is normal in size and structure. Venous: The inferior vena cava is normal in size with greater than 50% respiratory variability, suggesting right atrial pressure of 3 mmHg. IAS/Shunts: No atrial level shunt detected by color flow Doppler.  LEFT VENTRICLE PLAX 2D LVIDd:         2.80 cm   Diastology LVIDs:         2.00 cm   LV e' medial:    9.25 cm/s LV PW:         1.30 cm   LV E/e' medial:  6.6 LV IVS:        0.90 cm   LV e' lateral:   14.10 cm/s LVOT diam:     1.80 cm   LV E/e' lateral: 4.3 LV SV:         63 LV SV Index:   35 LVOT Area:     2.54 cm  RIGHT VENTRICLE RV Basal diam:  2.50 cm RV S prime:     8.27 cm/s LEFT ATRIUM             Index        RIGHT ATRIUM          Index LA diam:        2.90 cm 1.59 cm/m   RA Area:     7.11 cm LA Vol (A2C):   29.8 ml 16.38 ml/m  RA Volume:   12.90 ml 7.09 ml/m LA Vol (A4C):   13.3 ml 7.31 ml/m LA Biplane Vol: 20.8 ml 11.44 ml/m  AORTIC VALVE                     PULMONIC VALVE AV Area (Vmax):    3.10 cm      PV Vmax:       0.99 m/s AV Area (Vmean):   3.14 cm      PV Vmean:      73.100 cm/s AV Area (VTI):     3.14 cm  PV VTI:        0.181 m AV Vmax:           120.00 cm/s   PV Peak grad:  3.9 mmHg AV Vmean:          81.800 cm/s   PV Mean grad:  2.0 mmHg AV VTI:            0.201 m AV Peak Grad:      5.8 mmHg AV Mean Grad:      3.0 mmHg LVOT Vmax:         146.00 cm/s LVOT Vmean:        101.000 cm/s LVOT VTI:          0.248 m LVOT/AV VTI ratio: 1.23  AORTA Ao Root diam: 3.10 cm MITRAL VALVE MV Area (PHT): 5.93 cm    SHUNTS MV Decel Time: 128 msec    Systemic VTI:  0.25  m MV E velocity: 60.80 cm/s  Systemic Diam: 1.80 cm MV A velocity: 72.30 cm/s MV E/A ratio:  0.84 Lorine Bears MD Electronically signed by Lorine Bears MD Signature Date/Time: 04/17/2022/2:32:47 PM    Final    MR BRAIN WO CONTRAST  Addendum Date: 04/16/2022   ADDENDUM REPORT: 04/16/2022 22:16 ADDENDUM: Since time of the initial dictation, the remainder of the study has been sent to PACS for review. Patchy T2/FLAIR hyperintensity seen involving the supratentorial cerebral white matter, most characteristic of chronic microvascular ischemic disease, moderately advanced in nature. No other new or significant finding. Electronically Signed   By: Rise Mu M.D.   On: 04/16/2022 22:16   Result Date: 04/16/2022 CLINICAL DATA:  Initial evaluation for neuro deficit, stroke suspected. Dizziness with extremity tingling. EXAM: MRI HEAD WITHOUT CONTRAST TECHNIQUE: Multiplanar, multiecho pulse sequences of the brain and surrounding structures were obtained without intravenous contrast. COMPARISON:  Prior CT from 02/14/2022. FINDINGS: Brain: Examination technically limited with only diffusion weighted imaging performed. Diffusion-weighted sequences demonstrate no evidence for acute or subacute ischemia. Gray-white matter differentiation grossly maintained. No visible areas of chronic cortical infarction. No visible mass lesion, mass effect or midline shift. No hydrocephalus or extra-axial fluid collection. Vascular: Not well assessed on this limited exam. Skull and upper cervical spine: Not well assessed on this limited exam. Sinuses/Orbits: Not well assessed on this limited exam. Other: None. IMPRESSION: 1. Technically limited exam with only diffusion weighted imaging performed. 2. No acute or recent intracranial infarct. No other definite acute intracranial abnormality. Electronically Signed: By: Rise Mu M.D. On: 04/16/2022 20:43   CT HEAD CODE STROKE WO CONTRAST  Result Date:  04/16/2022 CLINICAL DATA:  Code stroke. Neuro deficit, acute, stroke suspected. Acute onset of dizziness and upper extremity weakness. EXAM: CT HEAD WITHOUT CONTRAST TECHNIQUE: Contiguous axial images were obtained from the base of the skull through the vertex without intravenous contrast. RADIATION DOSE REDUCTION: This exam was performed according to the departmental dose-optimization program which includes automated exposure control, adjustment of the mA and/or kV according to patient size and/or use of iterative reconstruction technique. COMPARISON:  None Available. FINDINGS: Brain: No abnormality seen affecting the brainstem or cerebellum. Within the cerebral hemispheres, there are scattered regions of low-density within the white matter. These could indicate chronic small vessel ischemic changes or evidence of demyelinating disease. No sign of cortical or large vessel territory infarction. No mass, hemorrhage, hydrocephalus or extra-axial collection. Vascular: No abnormal vascular finding. Skull: Normal Sinuses/Orbits: Clear/normal Other: None ASPECTS (Alberta Stroke Program Early CT Score) - Ganglionic level infarction (  caudate, lentiform nuclei, internal capsule, insula, M1-M3 cortex): 7 - Supraganglionic infarction (M4-M6 cortex): 3 Total score (0-10 with 10 being normal): 10 IMPRESSION: 1. No acute CT finding. Scattered regions of low-density within the white matter. These could indicate chronic small vessel ischemic changes or evidence of demyelinating disease. 2. Aspects is 10. These results were communicated to Dr. Amada Jupiter at 1:08 pm on 04/16/2022 by text page via the [facility] messaging system. Electronically Signed   By: Paulina Fusi M.D.   On: 04/16/2022 13:12    Microbiology: Results for orders placed or performed in visit on 11/03/21  Urine Culture     Status: None   Collection Time: 11/03/21 10:09 AM   Specimen: Urine, Clean Catch   UC  Result Value Ref Range Status   Urine Culture,  Routine Final report  Final   Organism ID, Bacteria Comment  Final    Comment: Mixed urogenital flora 25,000-50,000 colony forming units per mL     Labs: CBC: Recent Labs  Lab 04/16/22 1252  WBC 6.2  NEUTROABS 4.1  HGB 14.1  HCT 41.6  MCV 91.8  PLT 210   Basic Metabolic Panel: Recent Labs  Lab 04/16/22 1252  NA 140  K 3.8  CL 106  CO2 24  GLUCOSE 113*  BUN 20  CREATININE 0.83  CALCIUM 9.2   Liver Function Tests: Recent Labs  Lab 04/16/22 1252  AST 35  ALT 48*  ALKPHOS 85  BILITOT 0.8  PROT 7.6  ALBUMIN 4.1   CBG: Recent Labs  Lab 04/16/22 1247  GLUCAP 106*    Discharge time spent: greater than 30 minutes.  Signed: Hollice Espy, MD [facility] 04/17/2022

## 2022-04-17 NOTE — Evaluation (Signed)
Occupational Therapy Evaluation Patient Details Name: Rachel Colon MRN: [id number] DOB: 1965-07-31 Today's Date: 04/17/2022   History of Present Illness Rachel Colon is a 56 y.o. female with no significant past medical history who presents to the ER via private vehicle for evaluation after she felt diaphoretic and dizzy while at church with numbness and tingling involving both arms (Rt > Lt) associated with nausea and vomiting.  She also complained of chest tightness and said she had a similar sensation 2 days prior to this admission.  Patient states that she was in her usual state of health and was in church with her family when all of a sudden she felt very diaphoretic and dizzy with associated nausea.  She also noted numbness in both arms but more in her right arm and family noted facial asymmetry (Rt > Lt) .   Clinical Impression   Patient seen for OT evaluation, spouse present. Prior to admission, pt lived with spouse, was independent for ADLs/IADLs, and still driving. Pt is currently functioning at independent for ADLs, bed mobility, functional mobility to the bathroom, and toilet transfer with no AD. Pt reports symptoms have resolved. ROM, strength, sensation, vision, and coordination testing all WNL. No neurological deficits noted. At this time, pt does not demonstrate any acute OT needs. Will complete orders.     Recommendations for follow up therapy are one component of a multi-disciplinary discharge planning process, led by the attending physician.  Recommendations may be updated based on patient status, additional functional criteria and insurance authorization.   Follow Up Recommendations  No OT follow up     Assistance Recommended at Discharge None  Patient can return home with the following      Functional Status Assessment  Patient has not had a recent decline in their functional status  Equipment Recommendations  None recommended by OT    Recommendations for Other  Services       Precautions / Restrictions Precautions Precautions: None Restrictions Weight Bearing Restrictions: No      Mobility Bed Mobility Overal bed mobility: Independent             General bed mobility comments: for supine<>sit    Transfers Overall transfer level: Independent Equipment used: None               General transfer comment: STS from EOB and toilet      Balance Overall balance assessment: Independent                                         ADL either performed or assessed with clinical judgement   ADL Overall ADL's : Independent                                             Vision Baseline Vision/History: 1 Wears glasses (reading only) Patient Visual Report: No change from baseline Vision Assessment?: No apparent visual deficits Additional Comments: Pt denied blurry vision or diplopia, all vision testing WNL     Perception     Praxis      Pertinent Vitals/Pain Pain Assessment Pain Assessment: No/denies pain     Hand Dominance     Extremity/Trunk Assessment Upper Extremity Assessment Upper Extremity Assessment: Overall WFL for tasks assessed (BUE AROM and MMT WNL,  thumb opposition intact)   Lower Extremity Assessment Lower Extremity Assessment: Overall WFL for tasks assessed   Cervical / Trunk Assessment Cervical / Trunk Assessment: Normal   Communication Communication Communication: No difficulties   Cognition Arousal/Alertness: Awake/alert Behavior During Therapy: WFL for tasks assessed/performed Overall Cognitive Status: Within Functional Limits for tasks assessed                                       General Comments       Exercises Other Exercises Other Exercises: OT provided education re: role of OT, OT POC, post acute recs, sitting up for all meals, EOB/OOB mobility with assistance, home/fall safety.     Shoulder Instructions      Home Living  Family/patient expects to be discharged to:: Private residence Living Arrangements: Spouse/significant other Available Help at Discharge: Family;Available PRN/intermittently Type of Home: House       Home Layout: Two level;Able to live on main level with bedroom/bathroom     Bathroom Shower/Tub: Occupational psychologist: Standard     Home Equipment: None   Additional Comments: Husband works during the day      Prior Functioning/Environment Prior Level of Function : Independent/Modified Independent;Driving             Mobility Comments: independent no AD ADLs Comments: Independent for ADLs/IADLs, still driving, denied history of falls past 6 months        OT Problem List:        OT Treatment/Interventions:      OT Goals(Current goals can be found in the care plan section) Acute Rehab OT Goals Patient Stated Goal: return home OT Goal Formulation: All assessment and education complete, DC therapy  OT Frequency:      Co-evaluation              AM-PAC OT "6 Clicks" Daily Activity     Outcome Measure Help from another person eating meals?: None Help from another person taking care of personal grooming?: None Help from another person toileting, which includes using toliet, bedpan, or urinal?: None Help from another person bathing (including washing, rinsing, drying)?: None Help from another person to put on and taking off regular upper body clothing?: None Help from another person to put on and taking off regular lower body clothing?: None 6 Click Score: 24   End of Session Nurse Communication: Mobility status;Other (comment) (pt and husband wanting to speak with MD about imaging)  Activity Tolerance: Patient tolerated treatment well Patient left: in bed;with call bell/phone within reach;with family/visitor present                   Time: [id number] OT Time Calculation (min): 12 min Charges:  OT General Charges $OT Visit: 1 Visit OT  Evaluation $OT Eval Low Complexity: 1 Low  [facility] MS, OTR/L ascom [phone]  04/17/22, 9:48 AM

## 2022-04-17 NOTE — Assessment & Plan Note (Signed)
When asked about elevated blood pressures, patient tells me that her blood pressure is high when she is at her PCPs visit and they have attributed this to being anxious seeing a doctor.  Noted that her heart rate seems to run a little bit higher.  She does admit to some anxiety.  Since she is going to start medication for blood pressure as above, putting her on a beta-blocker is likely her best option.

## 2022-04-17 NOTE — Assessment & Plan Note (Signed)
Maybe more related to mini stroke.  Does not appear to be cardiac in nature.  Troponins have been completely normal and EKG noted normal sinus rhythm without any chronic findings.  Symptoms resolved in the emergency room.

## 2022-04-17 NOTE — Assessment & Plan Note (Signed)
When asked about high blood pressure, patient states that her blood pressure does tend to run high when she is at the doctor's office and when checked, they have attributed this to anxiety over seeing a doctor.  Given looking at CVA risk factors, started patient on very low-dose beta-blocker, this should both help with blood pressure and anxiety.

## 2022-04-17 NOTE — Discharge Instructions (Signed)
No Nexium while on Plavix for the next 3 weeks.

## 2022-08-17 ENCOUNTER — Other Ambulatory Visit: Payer: Self-pay | Admitting: Obstetrics and Gynecology

## 2022-08-17 DIAGNOSIS — Z1231 Encounter for screening mammogram for malignant neoplasm of breast: Secondary | ICD-10-CM

## 2022-11-14 NOTE — Progress Notes (Signed)
PCP: [facility]   No chief complaint on file.   HPI:      [name] is a 57 y.o. [id number] who LMP was No LMP recorded. (Menstrual status: Irregular Periods)., presents today for her annual examination.  Her menses are absent due to menopause. No PMB. Tolerable vasomotor sx.   Sex activity: not sexually active. No vag issues.   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 There is no FH of ovarian cancer. The patient does self-breast exams.  Colonoscopy: 2018, Repeat due after 10 years.   Tobacco use: The patient denies current or previous tobacco use. Alcohol use: none  No drug use Exercise: very active  She does get adequate calcium and Vitamin D in her diet.  Labs with PCP. Needs RF on acyclovir prn cold sore.  Having urinary urgency with decreased flow for 2 days, no dysuria, hematuria. Hasn't been drinking as much water and has had more sodas/tea.   Past Medical History:  Diagnosis Date  . Breast cyst 02/2005   bilateral  . H/O cold sores   . History of kidney stones     Past Surgical History:  Procedure Laterality Date  . COLONOSCOPY  06/16/2016  . COLPOSCOPY  1997  . CRYOTHERAPY  1997    Family History  Problem Relation Age of Onset  . Hypertension Father   . Heart attack Maternal Grandmother 50  . Breast cancer Neg Hx     Social History   Socioeconomic History  . Marital status: Married    Spouse name: Not on file  . Number of children: 2  . Years of education: Not on file  . Highest education level: Not on file  Occupational History  . Occupation: Geophysicist/field seismologist  Tobacco Use  . Smoking status: Never  . Smokeless tobacco: Never  Vaping Use  . Vaping Use: Never used  Substance and Sexual Activity  .  Alcohol use: Yes    Comment: rare  . Drug use: No  . Sexual activity: Not Currently    Partners: Male    Birth control/protection: Condom  Other Topics Concern  . Not on file  Social History Narrative  . Not on file   Social Determinants of Health   Financial Resource Strain: Not on file  Food Insecurity: No Food Insecurity (04/16/2022)   Hunger Vital Sign   . Worried About Programme researcher, broadcasting/film/video in the Last Year: Never true   . Ran Out of Food in the Last Year: Never true  Transportation Needs: No Transportation Needs (04/16/2022)   PRAPARE - Transportation   . Lack of Transportation (Medical): No   . Lack of Transportation (Non-Medical): No  Physical Activity: Sufficiently Active (03/27/2017)   Exercise Vital Sign   . Days of Exercise per Week: 7 days   . Minutes of Exercise per Session: 30 min  Stress: No Stress Concern Present (03/27/2017)   [employer] of Occupational Health - Occupational Stress Questionnaire   . Feeling of Stress : Not at all  Social Connections: Moderately Integrated (03/27/2017)   Social Connection and Isolation Panel [NHANES]   . Frequency of Communication with Friends and Family: More than three times a week   . Frequency of Social Gatherings with  Friends and Family: More than three times a week   . Attends Religious Services: More than 4 times per year   . Active Member of Clubs or Organizations: No   . Attends Banker Meetings: Never   . Marital Status: Married  Catering manager Violence: Not At Risk (04/16/2022)   Humiliation, Afraid, Rape, and Kick questionnaire   . Fear of Current or Ex-Partner: No   . Emotionally Abused: No   . Physically Abused: No   . Sexually Abused: No     Current Outpatient Medications:  .  aspirin EC 81 MG tablet, Take 1 tablet (81 mg total) by mouth daily. Swallow whole., Disp: 30 tablet, Rfl: 12 .  atorvastatin (LIPITOR) 40 MG tablet, Take 1 tablet (40 mg total) by mouth daily., Disp: 30 tablet,  Rfl: 2 .  carvedilol (COREG) 6.25 MG tablet, Take 1 tablet (6.25 mg total) by mouth 2 (two) times daily with a meal., Disp: 60 tablet, Rfl: 2 .  esomeprazole (NEXIUM) 40 MG capsule, Take 40 mg by mouth 2 (two) times daily before a meal., Disp: , Rfl:  .  fexofenadine (ALLEGRA) 60 MG tablet, Take 60 mg by mouth 2 (two) times daily., Disp: , Rfl:  .  VITAMIN D PO, Take by mouth., Disp: , Rfl:      ROS:  Review of Systems  Constitutional:  Negative for fatigue, fever and unexpected weight change.  Respiratory:  Negative for cough, shortness of breath and wheezing.   Cardiovascular:  Negative for chest pain, palpitations and leg swelling.  Gastrointestinal:  Negative for blood in stool, constipation, diarrhea, nausea and vomiting.  Endocrine: Negative for cold intolerance, heat intolerance and polyuria.  Genitourinary:  Positive for urgency. Negative for dyspareunia, dysuria, flank pain, frequency, genital sores, hematuria, menstrual problem, pelvic pain, vaginal bleeding, vaginal discharge and vaginal pain.  Musculoskeletal:  Negative for back pain, joint swelling and myalgias.  Skin:  Negative for rash.  Neurological:  Negative for dizziness, syncope, light-headedness, numbness and headaches.  Hematological:  Negative for adenopathy.  Psychiatric/Behavioral:  Negative for agitation, confusion, sleep disturbance and suicidal ideas. The patient is not nervous/anxious.   BREAST: No symptoms    Objective: There were no vitals taken for this visit.   Physical Exam Constitutional:      Appearance: She is well-developed.  Genitourinary:     Vulva normal.     Right Labia: No rash, tenderness or lesions.    Left Labia: No tenderness, lesions or rash.    No vaginal discharge, erythema or tenderness.      Right Adnexa: not tender and no mass present.    Left Adnexa: not tender and no mass present.    No cervical friability or polyp.     Uterus is not enlarged or tender.  Breasts:     Right: No mass, nipple discharge, skin change or tenderness.     Left: No mass, nipple discharge, skin change or tenderness.  Neck:     Thyroid: No thyromegaly.  Cardiovascular:     Rate and Rhythm: Normal rate and regular rhythm.     Heart sounds: Normal heart sounds. No murmur heard. Pulmonary:     Effort: Pulmonary effort is normal.     Breath sounds: Normal breath sounds.  Abdominal:     Palpations: Abdomen is soft.     Tenderness: There is no abdominal tenderness. There is no guarding or rebound.  Musculoskeletal:        General: Normal range of  motion.     Cervical back: Normal range of motion.  Lymphadenopathy:     Cervical: No cervical adenopathy.  Neurological:     General: No focal deficit present.     Mental Status: She is alert and oriented to person, place, and time.     Cranial Nerves: No cranial nerve deficit.  Skin:    General: Skin is warm and dry.  Psychiatric:        Mood and Affect: Mood normal.        Behavior: Behavior normal.        Thought Content: Thought content normal.        Judgment: Judgment normal.  Vitals reviewed.   No results found for this or any previous visit (from the past 24 hour(s)).    Assessment/Plan: Encounter for annual routine gynecological examination  Cervical cancer screening - Plan: Cytology - PAP  Screening for HPV (human papillomavirus) - Plan: Cytology - PAP  Encounter for screening mammogram for malignant neoplasm of breast; pt has mammo today  Cold sore - Plan: acyclovir (ZOVIRAX) 400 MG tablet; Rx RF.   UTI symptoms - Plan: Urine Culture, POCT Urinalysis Dipstick; neg UA, sx for 2 days. Check C&S. If neg, increase water. F/u prn.    No orders of the defined types were placed in this encounter.           GYN counsel breast self exam, mammography screening, menopause, adequate intake of calcium and vitamin D, diet and exercise    F/U  No follow-ups on file.  Haila Dena B. Jeferson Boozer, PA-C 11/14/2022 10:37 AM

## 2022-11-16 ENCOUNTER — Ambulatory Visit (INDEPENDENT_AMBULATORY_CARE_PROVIDER_SITE_OTHER): Payer: BC Managed Care – PPO | Admitting: Obstetrics and Gynecology

## 2022-11-16 ENCOUNTER — Ambulatory Visit
Admission: RE | Admit: 2022-11-16 | Discharge: 2022-11-16 | Disposition: A | Discharge status: Home / Self Care | Payer: BC Managed Care – PPO | Source: Ambulatory Visit | Attending: Obstetrics and Gynecology | Admitting: Obstetrics and Gynecology

## 2022-11-16 ENCOUNTER — Encounter: Payer: Self-pay | Admitting: Obstetrics and Gynecology

## 2022-11-16 VITALS — BP 110/70 | Ht 65.0 in | Wt 152.0 lb

## 2022-11-16 DIAGNOSIS — Z01419 Encounter for gynecological examination (general) (routine) without abnormal findings: Secondary | ICD-10-CM

## 2022-11-16 DIAGNOSIS — Z1231 Encounter for screening mammogram for malignant neoplasm of breast: Secondary | ICD-10-CM | POA: Insufficient documentation

## 2022-11-16 DIAGNOSIS — B001 Herpesviral vesicular dermatitis: Secondary | ICD-10-CM

## 2022-11-16 MED ORDER — ACYCLOVIR 400 MG PO TABS: 400.0000 mg | ORAL_TABLET | Freq: Every day | ORAL | 2 refills | Status: DC

## 2022-11-16 NOTE — Patient Instructions (Signed)
I value your feedback and you entrusting us with your care. If you get a [facility] patient survey, I would appreciate you taking the time to let us know about your experience today. Thank you! ? ? ?

## 2023-10-16 ENCOUNTER — Other Ambulatory Visit: Payer: Self-pay

## 2023-10-16 ENCOUNTER — Other Ambulatory Visit: Payer: Self-pay | Admitting: Obstetrics and Gynecology

## 2023-10-16 DIAGNOSIS — N390 Urinary tract infection, site not specified: Secondary | ICD-10-CM

## 2023-10-16 DIAGNOSIS — Z1231 Encounter for screening mammogram for malignant neoplasm of breast: Secondary | ICD-10-CM

## 2023-10-17 ENCOUNTER — Ambulatory Visit: Payer: Self-pay | Admitting: Urology

## 2023-10-19 ENCOUNTER — Ambulatory Visit: Payer: Self-pay | Admitting: Urology

## 2023-10-19 ENCOUNTER — Other Ambulatory Visit
Admission: RE | Admit: 2023-10-19 | Discharge: 2023-10-19 | Disposition: A | Discharge status: Home / Self Care | Attending: Urology | Admitting: Urology

## 2023-10-19 VITALS — BP 136/77 | HR 65 | Ht 65.0 in | Wt 158.0 lb

## 2023-10-19 DIAGNOSIS — N200 Calculus of kidney: Secondary | ICD-10-CM

## 2023-10-19 DIAGNOSIS — N390 Urinary tract infection, site not specified: Secondary | ICD-10-CM | POA: Diagnosis present

## 2023-10-19 DIAGNOSIS — N393 Stress incontinence (female) (male): Secondary | ICD-10-CM | POA: Diagnosis not present

## 2023-10-19 DIAGNOSIS — R3129 Other microscopic hematuria: Secondary | ICD-10-CM

## 2023-10-19 LAB — URINALYSIS, COMPLETE (UACMP) WITH MICROSCOPIC
Glucose, UA: NEGATIVE mg/dL
Ketones, ur: 15 mg/dL — AB
Nitrite: NEGATIVE
Protein, ur: 100 mg/dL — AB
Specific Gravity, Urine: >1.03 — ABNORMAL HIGH (ref 1.005–1.030)
pH: 5 (ref 5.0–8.0)

## 2023-10-19 LAB — BLADDER SCAN AMB NON-IMAGING

## 2023-10-19 MED ORDER — ESTRADIOL 0.1 MG/GM VA CREA: TOPICAL_CREAM | VAGINAL | 12 refills | Status: AC

## 2023-10-19 NOTE — Progress Notes (Signed)
 [name],acting as a scribe for Rachel Gimenez, MD.,have documented all relevant documentation on the behalf of Rachel Gimenez, MD,as directed by  Rachel Gimenez, MD while in the presence of Rachel Gimenez, MD.  10/19/23 2:43 PM   Rachel Colon 1965-10-30 [id number]  Referring provider: [name], NP [address] [place],  [place] [address]  Chief Complaint  Patient presents with   Recurrent UTI    HPI: 58 year old female with a personal history of kidney stones presents today for further evaluation of presumed recurrent UTIs. She reports being treated for a presumed UTI [place] severe pain. A urinalysis at that time was negative and suspicious for contamination.   She followed up with her PCP a month and a half later, still experiencing symptoms, and was treated with additional abx. Her urine at the time was negative.  Despite treatment, she continues to experience dysuria, although her urine tests have been negative for infection. She reports symptoms of burning during urination, difficulty urinating, frequent urination at night, and occasional back pain. She also notes an odor sometimes associated with her symptoms.   She has stress incontinence, experiencing leakage when sneezing.   She has a history of kidney stones and has seen a specialist, Samson Croak, for this in the past. She does not have any recent upper tract imaging for her urinary symptoms.   She is concerned about the possibility of menopause contributing to her symptoms, as she has been experiencing them more frequently since menopause.   She has not smoked cigarettes.   Today, her urinalysis shows microscopic blood and calcium  oxalate crystals.   Results for orders placed or performed in visit on 10/19/23  Bladder Scan (Post Void Residual) in office  Result Value Ref Range   Scan Result 8mL   Results for orders placed or  performed during the hospital encounter of 10/19/23  Urinalysis, Complete w Microscopic -  Result Value Ref Range   Color, Urine YELLOW YELLOW   APPearance HAZY (A) CLEAR   Specific Gravity, Urine >1.030 (H) 1.005 - 1.030   pH 5.0 5.0 - 8.0   Glucose, UA NEGATIVE NEGATIVE mg/dL   Hgb urine dipstick TRACE (A) NEGATIVE   Bilirubin Urine SMALL (A) NEGATIVE   Ketones, ur 15 (A) NEGATIVE mg/dL   Protein, ur 413 (A) NEGATIVE mg/dL   Nitrite NEGATIVE NEGATIVE   Leukocytes,Ua TRACE (A) NEGATIVE   Squamous Epithelial / HPF 0-5 0 - 5 /HPF   WBC, UA 0-5 0 - 5 WBC/hpf   RBC / HPF 11-20 0 - 5 RBC/hpf   Bacteria, UA FEW (A) NONE SEEN   Mucus PRESENT    Ca Oxalate Crys, UA PRESENT    Urine-Other LESS THAN 10 mL OF URINE SUBMITTED       PMH: Past Medical History:  Diagnosis Date   Breast cyst 02/2005   bilateral   CVA (cerebral vascular accident) (HCC) 2023   mini stroke due to stress per pt   H/O cold sores    History of kidney stones    Hypertension     Surgical History: Past Surgical History:  Procedure Laterality Date   COLONOSCOPY  06/16/2016   COLPOSCOPY  1997   CRYOTHERAPY  1997    Home Medications:  Allergies as of 10/19/2023       Reactions   Penicillins Other (See Comments)   infancy  Medication List        Accurate as of October 19, 2023  2:43 PM. If you have any questions, ask your nurse or doctor.          STOP taking these medications    acyclovir  400 MG tablet Commonly known as: ZOVIRAX        TAKE these medications    ALPRAZolam 0.25 MG tablet Commonly known as: XANAX Take 0.25 mg by mouth 2 (two) times daily as needed.   aspirin  EC 81 MG tablet Take 1 tablet (81 mg total) by mouth daily. Swallow whole.   atorvastatin  40 MG tablet Commonly known as: LIPITOR Take 1 tablet (40 mg total) by mouth daily.   busPIRone 10 MG tablet Commonly known as: BUSPAR Take 1 tablet by mouth 2 (two) times daily. What changed:  when to take  this Another medication with the same name was removed. Continue taking this medication, and follow the directions you see here.   carvedilol  6.25 MG tablet Commonly known as: COREG  Take 1 tablet (6.25 mg total) by mouth 2 (two) times daily with a meal.   estradiol 0.1 MG/GM vaginal cream Commonly known as: ESTRACE Estrogen Cream Instruction Discard applicator Apply pea sized amount to tip of finger to urethra before bed. Wash hands well after application. Use Monday, Wednesday and Friday   fexofenadine 60 MG tablet Commonly known as: ALLEGRA Take 60 mg by mouth 2 (two) times daily.   HYDROcodone-acetaminophen  5-325 MG tablet Commonly known as: NORCO/VICODIN Take 1 tablet by mouth every 6 (six) hours as needed.   meloxicam 15 MG tablet Commonly known as: MOBIC Take 15 mg by mouth daily.   traZODone 50 MG tablet Commonly known as: DESYREL Take 1 tablet by mouth at bedtime.        Allergies:  Allergies  Allergen Reactions   Penicillins Other (See Comments)    infancy     Family History: Family History  Problem Relation Age of Onset   Hypertension Father    Heart attack Maternal Grandmother 17   Breast cancer Neg Hx     Social History:  reports that she has never smoked. She has never used smokeless tobacco. She reports current alcohol use. She reports that she does not use drugs.   Physical Exam: BP 136/77 (BP Location: Left Arm, Patient Position: Sitting, Cuff Size: Normal)   Pulse 65   Ht 5' 5 (1.651 m)   Wt 158 lb (71.7 kg)   LMP 08/01/2020 (Approximate)   SpO2 99%   BMI 26.29 kg/m   Constitutional:  Alert and oriented, No acute distress. HEENT: [place] AT, moist mucus membranes.  Trachea midline, no masses. Neurologic: Grossly intact, no focal deficits, moving all 4 extremities. Psychiatric: Normal mood and affect.   Assessment & Plan:    1. Genitourinary Syndrome of Menopause (GSM) - She exhibits symptoms consistent with GSM, including dysuria and  urinary frequency, likely exacerbated by menopausal changes. - Initiate treatment with topical estrogen cream applied to the urethral area three times a week (Monday, Wednesday, Friday). -  - - Education provided on proper application techniques and reassure her about the minimal systemic absorption and associated risks.  - Recommend additional supplements such as Cranberry Tablets, Probiotics, and D-mannose for UTI prevention.  2. Microscopic Hematuria - Microscopic hematuria noted on urinalysis could be related to GSM or other urological conditions.  - Order a renal ultrasound to evaluate for kidney stones or other renal abnormalities.  - Plan for a cystoscopy  to rule out bladder cancer or other bladder pathologies, given the presence of hematuria and history of kidney stones.  3. History of Kidney Stones - The presence of calcium  oxalate crystals in the urine suggests a possible recurrence of kidney stones. - The renal ultrasound will help assess the current status of the kidneys and identify any stones.  - Advised her to maintain adequate hydration to prevent stone formation.  4. Stress Incontinence - She reports stress incontinence, which may be addressed after resolving the current urological issues. - Recommend Kegel exercises to strengthen pelvic floor muscles. - Further evaluation and management can be discussed in future visits.  Return in about 4 weeks (around 11/16/2023) for cystoscopy with Dr. Estanislao Heimlich.   [facility] [address], [address] [place], [place] [address] [phone]

## 2023-10-19 NOTE — Patient Instructions (Addendum)
 For UTI prevention take cranberry tablets, probiotic, D-Mannose daily.  [phone] please call to schedule your kidney ultrasound   Cystoscopy A cystoscopy may be done to find or treat a condition in your lower urinary tract. Your lower urinary tract includes your bladder and urethra. The urethra is the part of your body that drains pee (urine) from your bladder. You may need this procedure if: You have: Urinary tract infections (UTIs) that keep coming back. Blood in your pee. Pain when you pee. A blockage in your urethra, such as a urinary stone. You can't control when you pee, or you have to pee a lot. There are cells that aren't normal in your pee sample. A problem is found in your bladder during a test. You need a biopsy. This is when a small piece of tissue is removed for testing. Tell a health care provider about: Any allergies you have. All medicines you take. These include vitamins, herbs, eye drops, and creams. Any problems you or family members have had with anesthesia. Any bleeding problems you have. Any surgeries you've had. Any medical conditions you have. Whether you're pregnant or may be pregnant. What are the risks? Your health care provider will talk with you about risks. These may include: Infection. Bleeding. Allergic reactions to medicines. Damage to your urethra or bladder. What happens before the procedure? Medicines Ask about changing or stopping: Any medicines you take. Any vitamins, herbs, or supplements you take. Do not take aspirin  or ibuprofen unless you're told to. Tests You may have an exam or tests. These may include: Pee tests to check for signs of infection. X-rays of: Your bladder. Your urethra. Your kidneys. A CT scan of your belly or hips. General instructions Eat and drink only as you've been told. For your safety, you may: Need to wash your skin with a soap that kills germs. Get antibiotics. Have hair removed at the procedure  site. If you'll be going home right after the procedure, plan to have a responsible adult: Take you home from the hospital or clinic. You won't be allowed to drive. Stay with you for the time you're told. What happens during the procedure?  You may be given: A sedative to help you relax. Anesthesia to keep you from feeling pain. The opening of your urethra will be cleaned. A thin tube called a cystoscope will be put into your urethra. The tube has a light and camera on the end of it. The tube will be passed into your bladder. A germ-free (sterile) fluid will flow through the tube. The fluid will stretch your bladder. This helps your provider see the walls of your bladder more clearly. A biopsy may be taken. Stones may be removed. The tube will be taken out. Your bladder will be emptied. The procedure may vary among providers and hospitals. What can I expect after the procedure? It's common to have: Some soreness or pain in your belly and urethra. Mild pain or burning when you pee. The pain should stop a few minutes after you pee. This may last for up to a week. A small amount of blood in your pee for a few days. A feeling like you need to pee often. But when you do, you may only pee a little. Follow these instructions at home: Medicines Take your medicines only as told. If you were given antibiotics, take them as told. Do not stop taking them even if you start to feel better. General instructions If you were given a sedative,  do not drive or use machines until you're told it's safe. A sedative can make you sleepy. Eat and drink as told. If a biopsy was taken, ask when your test results will be ready and how to get them. You may need to call or meet with your provider to get your results. Ask what things are safe for you to do at home. Ask when you can go back to work or school. Contact a health care provider if: Your pain gets worse. Your pain doesn't get better with medicine. You  have trouble peeing. You have more blood in your pee. You have a fever or chills. Get help right away if: You have blood clots in your pee. You can't pee. This information is not intended to replace advice given to you by your health care provider. Make sure you discuss any questions you have with your health care provider. Document Revised: 08/29/2022 Document Reviewed: 08/29/2022 Elsevier Patient Education  2024 [employer].

## 2023-10-25 ENCOUNTER — Ambulatory Visit
Admission: RE | Admit: 2023-10-25 | Discharge: 2023-10-25 | Disposition: A | Discharge status: Home / Self Care | Source: Ambulatory Visit | Attending: Urology | Admitting: Urology

## 2023-10-25 DIAGNOSIS — N200 Calculus of kidney: Secondary | ICD-10-CM | POA: Diagnosis present

## 2023-10-25 DIAGNOSIS — N390 Urinary tract infection, site not specified: Secondary | ICD-10-CM | POA: Insufficient documentation

## 2023-10-31 ENCOUNTER — Ambulatory Visit: Payer: Self-pay | Admitting: Urology

## 2023-11-13 ENCOUNTER — Encounter: Payer: Self-pay | Admitting: Urology

## 2023-11-13 ENCOUNTER — Ambulatory Visit: Admitting: Urology

## 2023-11-13 VITALS — BP 125/74 | HR 74

## 2023-11-13 DIAGNOSIS — N390 Urinary tract infection, site not specified: Secondary | ICD-10-CM | POA: Diagnosis not present

## 2023-11-13 MED ORDER — SULFAMETHOXAZOLE-TRIMETHOPRIM 800-160 MG PO TABS
1.0000 | ORAL_TABLET | Freq: Once | ORAL | Status: AC
  Administered 2023-11-13: 1 via ORAL

## 2023-11-13 MED ORDER — LIDOCAINE HCL URETHRAL/MUCOSAL 2 % EX GEL
1.0000 | Freq: Once | CUTANEOUS | Status: AC
  Administered 2023-11-13: 1 via URETHRAL

## 2023-11-13 NOTE — Patient Instructions (Signed)
 For UTI prevention take cranberry tablets

## 2023-11-13 NOTE — Progress Notes (Signed)
 Cystoscopy Procedure Note:  Indication: Dysuria  Bactrim  given for prophylaxis  After informed consent and discussion of the procedure and its risks, Rachel Colon was positioned and prepped in the standard fashion. Cystoscopy was performed with a flexible cystoscope. The urethra, bladder neck and entire bladder was visualized in a standard fashion.  Bladder mucosa was grossly normal throughout, no abnormalities on retroflexion.  The ureteral orifices were visualized in their normal location and orientation.  Imaging: Renal US  benign  Findings: Normal cystoscopy  Assessment and Plan: Agree with topical estrogen cream and cranberry tablets for UTI prevention RTC PA 3 months symptom check  Redell Burnet, MD 11/13/2023

## 2023-11-20 ENCOUNTER — Ambulatory Visit
Admission: RE | Admit: 2023-11-20 | Discharge: 2023-11-20 | Disposition: A | Discharge status: Home / Self Care | Payer: Self-pay | Source: Ambulatory Visit | Attending: Obstetrics and Gynecology | Admitting: Obstetrics and Gynecology

## 2023-11-20 DIAGNOSIS — Z1231 Encounter for screening mammogram for malignant neoplasm of breast: Secondary | ICD-10-CM | POA: Insufficient documentation

## 2023-11-26 ENCOUNTER — Ambulatory Visit: Payer: Self-pay | Admitting: Obstetrics and Gynecology

## 2023-12-05 NOTE — Progress Notes (Deleted)
 PCP: Harvey Gaetana CROME, NP   No chief complaint on file.   HPI:      [name] is a 58 y.o. [id number] who LMP was Patient's last menstrual period was 08/01/2020 (approximate)., presents today for her annual examination.  Her menses are absent due to menopause. No PMB. Tolerable vasomotor sx.   Sex activity: not sexually active. No vag issues. Notes vaginal dryness occas, not bothersome. Using dove sens skin soap, no dryer sheets.  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 There is no FH of ovarian cancer. The patient does self-breast exams.  Colonoscopy: 2018, Repeat due after 10 years.   Tobacco use: The patient denies current or previous tobacco use. Alcohol use: none  No drug use Exercise: very active  She does get adequate calcium  but not Vitamin D in her diet. S/p mini stroke due to stress end of 2023; was taken off all supplements. Doing well now.   Labs with PCP. Needs RF on acyclovir  prn cold sore.   Past Medical History:  Diagnosis Date   Breast cyst 02/2005   bilateral   CVA (cerebral vascular accident) (HCC) 2023   mini stroke due to stress per pt   H/O cold sores    History of kidney stones    Hypertension     Past Surgical History:  Procedure Laterality Date   COLONOSCOPY  06/16/2016   COLPOSCOPY  1997   CRYOTHERAPY  1997    Family History  Problem Relation Age of Onset   Hypertension Father    Heart attack Maternal Grandmother 51   Breast cancer Neg Hx     Social History   Socioeconomic History   Marital status: Married    Spouse name: Not on file   Number of children: 2   Years of education: Not on file   Highest education level: Not on file  Occupational History   Occupation: Geophysicist/field seismologist  Tobacco Use   Smoking status: Never    Smokeless tobacco: Never  Vaping Use   Vaping status: Never Used  Substance and Sexual Activity   Alcohol use: Not Currently    Comment: rare   Drug use: No   Sexual activity: Not Currently    Partners: Male    Birth control/protection: None  Other Topics Concern   Not on file  Social History Narrative   Not on file   Social Drivers of Health   Financial Resource Strain: Low Risk  (09/19/2023)   Received from [facility]   Overall Financial Resource Strain (CARDIA)    Difficulty of Paying Living Expenses: Not hard at all  Food Insecurity: No Food Insecurity (09/19/2023)   Received from [facility]   Hunger Vital Sign    Within the past 12 months, you worried that your food would run out before you got the money to buy more.: Never true    Within the past 12 months, the food you bought just didn't last and you didn't have money to get more.: Never true  Transportation Needs: No Transportation Needs (09/19/2023)   Received from [facility] - Transportation    In the past 12 months, has lack of transportation kept you from medical appointments or from getting medications?: No  Lack of Transportation (Non-Medical): No  Physical Activity: Sufficiently Active (03/27/2017)   Exercise Vital Sign    Days of Exercise per Week: 7 days    Minutes of Exercise per Session: 30 min  Stress: No Stress Concern Present (03/27/2017)   [employer] of Occupational Health - Occupational Stress Questionnaire    Feeling of Stress : Not at all  Social Connections: Moderately Integrated (03/27/2017)   Social Connection and Isolation Panel    Frequency of Communication with Friends and Family: More than three times a week    Frequency of Social Gatherings with Friends and Family: More than three times a week    Attends Religious Services: More than 4 times per year    Active Member of [employer] or Organizations: No    Attends Tax inspector Meetings: Never    Marital Status: Married  Catering manager Violence: Not At Risk (04/16/2022)   Humiliation, Afraid, Rape, and Kick questionnaire    Fear of Current or Ex-Partner: No    Emotionally Abused: No    Physically Abused: No    Sexually Abused: No     Current Outpatient Medications:    aspirin  EC 81 MG tablet, Take 1 tablet (81 mg total) by mouth daily. Swallow whole., Disp: 30 tablet, Rfl: 12   atorvastatin  (LIPITOR) 40 MG tablet, Take 1 tablet (40 mg total) by mouth daily., Disp: 30 tablet, Rfl: 2   carvedilol  (COREG ) 6.25 MG tablet, Take 1 tablet (6.25 mg total) by mouth 2 (two) times daily with a meal., Disp: 60 tablet, Rfl: 2   estradiol  (ESTRACE ) 0.1 MG/GM vaginal cream, Estrogen Cream Instruction Discard applicator Apply pea sized amount to tip of finger to urethra before bed. Wash hands well after application. Use Monday, Wednesday and Friday, Disp: 42.5 g, Rfl: 12   fexofenadine (ALLEGRA) 60 MG tablet, Take 60 mg by mouth 2 (two) times daily., Disp: , Rfl:    meloxicam (MOBIC) 15 MG tablet, Take 15 mg by mouth daily., Disp: , Rfl:      ROS:  Review of Systems  Constitutional:  Negative for fatigue, fever and unexpected weight change.  Respiratory:  Negative for cough, shortness of breath and wheezing.   Cardiovascular:  Negative for chest pain, palpitations and leg swelling.  Gastrointestinal:  Negative for blood in stool, constipation, diarrhea, nausea and vomiting.  Endocrine: Negative for cold intolerance, heat intolerance and polyuria.  Genitourinary:  Negative for dyspareunia, dysuria, flank pain, frequency, genital sores, hematuria, menstrual problem, pelvic pain, urgency, vaginal bleeding, vaginal discharge and vaginal pain.  Musculoskeletal:  Negative for back pain, joint swelling and myalgias.  Skin:  Negative for rash.  Neurological:  Negative for dizziness, syncope, light-headedness, numbness and headaches.  Hematological:  Negative for  adenopathy.  Psychiatric/Behavioral:  Positive for agitation. Negative for confusion, sleep disturbance and suicidal ideas. The patient is not nervous/anxious.   BREAST: No symptoms    Objective: LMP 08/01/2020 (Approximate)    Physical Exam Constitutional:      Appearance: She is well-developed.  Genitourinary:     Vulva normal.     Right Labia: No rash, tenderness or lesions.    Left Labia: No tenderness, lesions or rash.    No vaginal discharge, erythema or tenderness.      Right Adnexa: not tender and no mass present.    Left Adnexa: not tender and no mass present.    No cervical friability or polyp.     Uterus is not enlarged or tender.  Breasts:    Right: No mass, nipple discharge, skin change or tenderness.     Left: No mass, nipple discharge, skin change or tenderness.  Neck:     Thyroid: No thyromegaly.  Cardiovascular:     Rate and Rhythm: Normal rate and regular rhythm.     Heart sounds: Normal heart sounds. No murmur heard. Pulmonary:     Effort: Pulmonary effort is normal.     Breath sounds: Normal breath sounds.  Abdominal:     Palpations: Abdomen is soft.     Tenderness: There is no abdominal tenderness. There is no guarding or rebound.  Musculoskeletal:        General: Normal range of motion.     Cervical back: Normal range of motion.  Lymphadenopathy:     Cervical: No cervical adenopathy.  Neurological:     General: No focal deficit present.     Mental Status: She is alert and oriented to person, place, and time.     Cranial Nerves: No cranial nerve deficit.  Skin:    General: Skin is warm and dry.  Psychiatric:        Mood and Affect: Mood normal.        Behavior: Behavior normal.        Thought Content: Thought content normal.        Judgment: Judgment normal.  Vitals reviewed.     Assessment/Plan: Encounter for annual routine gynecological examination  Encounter for screening mammogram for malignant neoplasm of breast; pt has mammo  today  Cold sore - Plan: acyclovir  (ZOVIRAX ) 400 MG tablet; Rx RF.    No orders of the defined types were placed in this encounter.           GYN counsel breast self exam, mammography screening, menopause, adequate intake of calcium  and vitamin D, diet and exercise    F/U  No follow-ups on file.  Karolee Meloni B. Marlaine Arey, PA-C 12/05/2023 10:02 AM

## 2023-12-06 ENCOUNTER — Encounter: Payer: Self-pay | Admitting: Obstetrics and Gynecology

## 2023-12-06 ENCOUNTER — Ambulatory Visit: Payer: Self-pay | Admitting: Obstetrics and Gynecology

## 2023-12-06 ENCOUNTER — Ambulatory Visit: Payer: Self-pay

## 2023-12-06 ENCOUNTER — Ambulatory Visit: Admitting: Obstetrics and Gynecology

## 2023-12-06 VITALS — BP 109/71 | HR 83 | Ht 65.0 in | Wt 160.0 lb

## 2023-12-06 DIAGNOSIS — Z01419 Encounter for gynecological examination (general) (routine) without abnormal findings: Secondary | ICD-10-CM

## 2023-12-06 DIAGNOSIS — Z01411 Encounter for gynecological examination (general) (routine) with abnormal findings: Secondary | ICD-10-CM

## 2023-12-06 DIAGNOSIS — Z1231 Encounter for screening mammogram for malignant neoplasm of breast: Secondary | ICD-10-CM

## 2023-12-06 DIAGNOSIS — B001 Herpesviral vesicular dermatitis: Secondary | ICD-10-CM

## 2023-12-06 MED ORDER — ACYCLOVIR 400 MG PO TABS: 400.0000 mg | ORAL_TABLET | Freq: Every day | ORAL | 2 refills | Status: AC

## 2023-12-06 NOTE — Progress Notes (Signed)
 PCP: Harvey Gaetana CROME, NP   Chief Complaint  Patient presents with   Gynecologic Exam    No concerns    HPI:      [name] is a 58 y.o. [id number] who LMP was Patient's last menstrual period was 08/01/2020 (approximate)., presents today for her annual examination.  Her menses are absent due to menopause. No PMB. Tolerable vasomotor sx, ebb and flow.   Sex activity: not sexually active. No vag issues. Notes vaginal dryness occas, not bothersome. Using dove sens skin soap, no dryer sheets. Started vag ERT ext M, W, F for recurrent UTI with urology. Doing better.   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 There is no FH of ovarian cancer. The patient does self-breast exams.  Colonoscopy: 2018, Repeat due after 10 years.   Tobacco use: The patient denies current or previous tobacco use. Alcohol use: none  No drug use Exercise: very active  She does get adequate calcium  but not Vitamin D in her diet. S/p mini stroke due to stress end of 2023; was taken off all supplements. Doing well now.   Labs with PCP. Needs RF on acyclovir  prn cold sore.   Past Medical History:  Diagnosis Date   Breast cyst 02/2005   bilateral   CVA (cerebral vascular accident) (HCC) 2023   mini stroke due to stress per pt   H/O cold sores    History of kidney stones    Hypertension     Past Surgical History:  Procedure Laterality Date   COLONOSCOPY  06/16/2016   COLPOSCOPY  1997   CRYOTHERAPY  1997    Family History  Problem Relation Age of Onset   Hypertension Father    Heart attack Maternal Grandmother 46   Breast cancer Neg Hx     Social History   Socioeconomic History   Marital status: Married    Spouse name: Not on file   Number of children: 2   Years of education: Not on file    Highest education level: Not on file  Occupational History   Occupation: Geophysicist/field seismologist  Tobacco Use   Smoking status: Never   Smokeless tobacco: Never  Vaping Use   Vaping status: Never Used  Substance and Sexual Activity   Alcohol use: Not Currently   Drug use: No   Sexual activity: Not Currently    Partners: Male    Birth control/protection: Post-menopausal  Other Topics Concern   Not on file  Social History Narrative   Not on file   Social Drivers of Health   Financial Resource Strain: Low Risk  (09/19/2023)   Received from [facility]   Overall Financial Resource Strain (CARDIA)    Difficulty of Paying Living Expenses: Not hard at all  Food Insecurity: No Food Insecurity (09/19/2023)   Received from [facility]   Hunger Vital Sign    Within the past 12 months, you worried that your food would run out before you got the money to buy more.: Never true    Within the past 12 months, the food you bought just didn't last and you didn't have money to get more.: Never true  Transportation Needs: No Transportation Needs (09/19/2023)   Received from [facility]   PRAPARE -  Transportation    In the past 12 months, has lack of transportation kept you from medical appointments or from getting medications?: No    Lack of Transportation (Non-Medical): No  Physical Activity: Sufficiently Active (03/27/2017)   Exercise Vital Sign    Days of Exercise per Week: 7 days    Minutes of Exercise per Session: 30 min  Stress: No Stress Concern Present (03/27/2017)   [employer] of Occupational Health - Occupational Stress Questionnaire    Feeling of Stress : Not at all  Social Connections: Moderately Integrated (03/27/2017)   Social Connection and Isolation Panel    Frequency of Communication with Friends and Family: More than three times a week    Frequency of Social Gatherings with Friends and Family: More than three times a week     Attends Religious Services: More than 4 times per year    Active Member of [employer] or Organizations: No    Attends Banker Meetings: Never    Marital Status: Married  Catering manager Violence: Not At Risk (04/16/2022)   Humiliation, Afraid, Rape, and Kick questionnaire    Fear of Current or Ex-Partner: No    Emotionally Abused: No    Physically Abused: No    Sexually Abused: No     Current Outpatient Medications:    aspirin  EC 81 MG tablet, Take 1 tablet (81 mg total) by mouth daily. Swallow whole., Disp: 30 tablet, Rfl: 12   atorvastatin  (LIPITOR) 40 MG tablet, Take 1 tablet (40 mg total) by mouth daily., Disp: 30 tablet, Rfl: 2   carvedilol  (COREG ) 6.25 MG tablet, Take 1 tablet (6.25 mg total) by mouth 2 (two) times daily with a meal., Disp: 60 tablet, Rfl: 2   estradiol  (ESTRACE ) 0.1 MG/GM vaginal cream, Estrogen Cream Instruction Discard applicator Apply pea sized amount to tip of finger to urethra before bed. Wash hands well after application. Use Monday, Wednesday and Friday, Disp: 42.5 g, Rfl: 12   fexofenadine (ALLEGRA) 60 MG tablet, Take 60 mg by mouth 2 (two) times daily., Disp: , Rfl:    meloxicam (MOBIC) 15 MG tablet, Take 15 mg by mouth daily., Disp: , Rfl:    acyclovir  (ZOVIRAX ) 400 MG tablet, Take 1 tablet (400 mg total) by mouth 5 (five) times daily. For 5 days prn sx, Disp: 25 tablet, Rfl: 2     ROS:  Review of Systems  Constitutional:  Negative for fatigue, fever and unexpected weight change.  Respiratory:  Negative for cough, shortness of breath and wheezing.   Cardiovascular:  Negative for chest pain, palpitations and leg swelling.  Gastrointestinal:  Negative for blood in stool, constipation, diarrhea, nausea and vomiting.  Endocrine: Negative for cold intolerance, heat intolerance and polyuria.  Genitourinary:  Negative for dyspareunia, dysuria, flank pain, frequency, genital sores, hematuria, menstrual problem, pelvic pain, urgency, vaginal  bleeding, vaginal discharge and vaginal pain.  Musculoskeletal:  Negative for back pain, joint swelling and myalgias.  Skin:  Negative for rash.  Neurological:  Negative for dizziness, syncope, light-headedness, numbness and headaches.  Hematological:  Negative for adenopathy.  Psychiatric/Behavioral:  Negative for agitation, confusion, sleep disturbance and suicidal ideas. The patient is not nervous/anxious.   BREAST: No symptoms    Objective: BP 109/71   Pulse 83   Ht 5' 5 (1.651 m)   Wt 160 lb (72.6 kg)   LMP 08/01/2020 (Approximate)   BMI 26.63 kg/m    Physical Exam Constitutional:      Appearance: She is well-developed.  Genitourinary:     Vulva normal.     Right Labia: No rash, tenderness or lesions.    Left Labia: No tenderness, lesions or rash.    No vaginal discharge, erythema or tenderness.     Moderate vaginal atrophy present.     Right Adnexa: not tender and no mass present.    Left Adnexa: not tender and no mass present.    No cervical friability or polyp.     Uterus is not enlarged or tender.  Breasts:    Right: No mass, nipple discharge, skin change or tenderness.     Left: No mass, nipple discharge, skin change or tenderness.  Neck:     Thyroid: No thyromegaly.  Cardiovascular:     Rate and Rhythm: Normal rate and regular rhythm.     Heart sounds: Normal heart sounds. No murmur heard. Pulmonary:     Effort: Pulmonary effort is normal.     Breath sounds: Normal breath sounds.  Abdominal:     Palpations: Abdomen is soft.     Tenderness: There is no abdominal tenderness. There is no guarding or rebound.  Musculoskeletal:        General: Normal range of motion.     Cervical back: Normal range of motion.  Lymphadenopathy:     Cervical: No cervical adenopathy.  Neurological:     General: No focal deficit present.     Mental Status: She is alert and oriented to person, place, and time.     Cranial Nerves: No cranial nerve deficit.  Skin:    General:  Skin is warm and dry.  Psychiatric:        Mood and Affect: Mood normal.        Behavior: Behavior normal.        Thought Content: Thought content normal.        Judgment: Judgment normal.  Vitals reviewed.     Assessment/Plan: Encounter for annual routine gynecological examination  Encounter for screening mammogram for malignant neoplasm of breast; pt current on mammo  Cold sore - Plan: acyclovir  (ZOVIRAX ) 400 MG tablet; Rx RF eRxd.    Meds ordered this encounter  Medications   acyclovir  (ZOVIRAX ) 400 MG tablet    Sig: Take 1 tablet (400 mg total) by mouth 5 (five) times daily. For 5 days prn sx    Dispense:  25 tablet    Refill:  2    Supervising Provider:   ROBY, MICIA [[id number]]            GYN counsel breast self exam, mammography screening, menopause, adequate intake of calcium  and vitamin D, diet and exercise    F/U  Return in about 1 year (around 12/05/2024).  Rachel Tatsch B. Verona Hartshorn, PA-C 12/06/2023 4:10 PM

## 2023-12-06 NOTE — Patient Instructions (Signed)
 I value your feedback and you entrusting [place] with your care. If you get a [place] patient survey, I would appreciate you taking the time to let us know about your experience today. Thank you! ? ? ?

## 2024-02-13 ENCOUNTER — Ambulatory Visit: Admitting: Physician Assistant

## 2024-02-13 ENCOUNTER — Encounter: Payer: Self-pay | Admitting: Physician Assistant

## 2024-02-13 VITALS — BP 100/63 | HR 88 | Ht 65.0 in | Wt 160.8 lb

## 2024-02-13 DIAGNOSIS — N390 Urinary tract infection, site not specified: Secondary | ICD-10-CM | POA: Diagnosis not present

## 2024-02-13 NOTE — Progress Notes (Signed)
 02/13/2024 2:19 PM   Rachel Colon 03/14/1966 [id number]  CC: Chief Complaint  Patient presents with   Recurrent UTI   HPI: Rachel Colon is a 58 y.o. female with PMH urolithiasis, GSM on estrogen cream, microscopic hematuria with benign cystoscopy with Dr. Francisca in July, stress incontinence, and recurrent UTI who presents today for follow-up.   Today she reports no UTIs since her last office visit.  She is using estrogen cream 1-2 times weekly, but admits it is easy to forget to use it.  She is also taking Uro brand probiotics.  She has had some new constipation since starting the probiotic.  PMH: Past Medical History:  Diagnosis Date   Breast cyst 02/2005   bilateral   CVA (cerebral vascular accident) (HCC) 2023   mini stroke due to stress per pt   H/O cold sores    History of kidney stones    Hypertension     Surgical History: Past Surgical History:  Procedure Laterality Date   COLONOSCOPY  06/16/2016   COLPOSCOPY  1997   CRYOTHERAPY  1997    Home Medications:  Allergies as of 02/13/2024       Reactions   Penicillins Other (See Comments)   infancy        Medication List        Accurate as of February 13, 2024  2:19 PM. If you have any questions, ask your nurse or doctor.          acyclovir  400 MG tablet Commonly known as: ZOVIRAX  Take 1 tablet (400 mg total) by mouth 5 (five) times daily. For 5 days prn sx   aspirin  EC 81 MG tablet Take 1 tablet (81 mg total) by mouth daily. Swallow whole.   atorvastatin  40 MG tablet Commonly known as: LIPITOR Take 1 tablet (40 mg total) by mouth daily.   carvedilol  6.25 MG tablet Commonly known as: COREG  Take 1 tablet (6.25 mg total) by mouth 2 (two) times daily with a meal.   estradiol  0.1 MG/GM vaginal cream Commonly known as: ESTRACE  Estrogen Cream Instruction Discard applicator Apply pea sized amount to tip of finger to urethra before bed. Wash hands well after application. Use Monday, Wednesday and  Friday   fexofenadine 60 MG tablet Commonly known as: ALLEGRA Take 60 mg by mouth 2 (two) times daily.   meloxicam 15 MG tablet Commonly known as: MOBIC Take 15 mg by mouth daily.        Allergies:  Allergies  Allergen Reactions   Penicillins Other (See Comments)    infancy     Family History: Family History  Problem Relation Age of Onset   Hypertension Father    Heart attack Maternal Grandmother 48   Breast cancer Neg Hx     Social History:   reports that she has never smoked. She has never used smokeless tobacco. She reports that she does not currently use alcohol. She reports that she does not use drugs.  Physical Exam: LMP 08/01/2020 (Approximate)   Constitutional:  Alert and oriented, no acute distress, nontoxic appearing HEENT: [place], AT Cardiovascular: No clubbing, cyanosis, or edema Respiratory: Normal respiratory effort, no increased work of breathing Skin: No rashes, bruises or suspicious lesions Neurologic: Grossly intact, no focal deficits, moving all 4 extremities Psychiatric: Normal mood and affect  Assessment & Plan:   1. Recurrent UTI (Primary) Asymptomatic x 3 months.  We reviewed UTI prevention strategies including staying well-hydrated, eating healthy diet containing fruits and vegetables, cranberry  supplements, d-mannose supplements, and a bowel regimen to manage her constipation.  Continue probiotics and estrogen cream.  Return in about 1 year (around 02/12/2025) for Annual follow-up.  Lucie Hones, PA-C  [facility] [place] [address], [address] [place], [place] [address] [phone]

## 2024-02-13 NOTE — Patient Instructions (Signed)
 Recurrent UTI Prevention Strategies  Stay well hydrated (64 oz daily). Eat a diet rich in fruit and vegetables. Start a bowel regimen to manage your constipation. Your goal is to have consistent, formed bowel movements that are easy for you to pass. You may use either of the over-the-counter supplements Benefiber or Miralax to help with this. I recommend that you try Benefiber first and move on to Miralax if this is not helping you enough. You may adjust the recommended dose of Miralax (one capful daily) to achieve this goal. Start taking an over-the-counter cranberry supplement, with or without d-mannose, for urinary tract health. Take this once or twice daily on an empty stomach, e.g. right before bed. Continue taking an over-the-counter probiotic containing the bacterial species called Lactobacillus. Take this daily. Continue vaginal estrogen cream. Apply a pea-sized amount around the opening of the urethra three times weekly.

## 2025-02-12 ENCOUNTER — Ambulatory Visit: Admitting: Physician Assistant
